# Patient Record
Sex: Female | Born: 1945 | Race: White | Hispanic: No | Smoking: Never smoker
Health system: Southern US, Community
[De-identification: ages and names within clinical notes are randomized; demographics above are authoritative.]

## PROBLEM LIST (undated history)

## (undated) DIAGNOSIS — I82409 Acute embolism and thrombosis of unspecified deep veins of unspecified lower extremity: Secondary | ICD-10-CM

## (undated) HISTORY — PX: BACK SURGERY: SHX140

## (undated) HISTORY — PX: ABDOMINAL HYSTERECTOMY: SHX81

---

## 2011-08-20 ENCOUNTER — Ambulatory Visit: Payer: Self-pay | Admitting: Internal Medicine

## 2011-09-22 DIAGNOSIS — E782 Mixed hyperlipidemia: Secondary | ICD-10-CM | POA: Insufficient documentation

## 2011-10-05 DIAGNOSIS — N952 Postmenopausal atrophic vaginitis: Secondary | ICD-10-CM | POA: Insufficient documentation

## 2012-03-19 ENCOUNTER — Ambulatory Visit: Payer: Self-pay | Admitting: Internal Medicine

## 2012-03-19 LAB — COMPREHENSIVE METABOLIC PANEL
Albumin: 3.6 g/dL (ref 3.4–5.0)
Alkaline Phosphatase: 83 U/L (ref 50–136)
Anion Gap: 6 — ABNORMAL LOW (ref 7–16)
Calcium, Total: 9.2 mg/dL (ref 8.5–10.1)
Chloride: 107 mmol/L (ref 98–107)
Co2: 30 mmol/L (ref 21–32)
EGFR (African American): >60
Glucose: 88 mg/dL (ref 65–99)
Osmolality: 284 (ref 275–301)
Potassium: 3.8 mmol/L (ref 3.5–5.1)
SGOT(AST): 24 U/L (ref 15–37)
Sodium: 143 mmol/L (ref 136–145)

## 2012-03-19 LAB — URINALYSIS, COMPLETE
Glucose,UR: NEGATIVE mg/dL (ref 0–75)
Nitrite: NEGATIVE
Protein: NEGATIVE
Specific Gravity: 1.005 (ref 1.003–1.030)

## 2012-03-19 LAB — CBC WITH DIFFERENTIAL/PLATELET
Basophil #: 0.1 10*3/uL (ref 0.0–0.1)
Basophil %: 0.8 %
Eosinophil #: 0.1 10*3/uL (ref 0.0–0.7)
Eosinophil %: 1.8 %
HGB: 13.8 g/dL (ref 12.0–16.0)
Lymphocyte %: 30.6 %
MCH: 30.6 pg (ref 26.0–34.0)
MCHC: 34.5 g/dL (ref 32.0–36.0)
Monocyte #: 0.6 x10 3/mm (ref 0.2–0.9)
Monocyte %: 8.2 %
Neutrophil %: 58.6 %
RBC: 4.51 10*6/uL (ref 3.80–5.20)
WBC: 7.8 10*3/uL (ref 3.6–11.0)

## 2012-03-19 LAB — SEDIMENTATION RATE: Erythrocyte Sed Rate: 13 mm/hr (ref 0–30)

## 2012-03-20 LAB — URINE CULTURE

## 2013-05-25 DIAGNOSIS — L900 Lichen sclerosus et atrophicus: Secondary | ICD-10-CM | POA: Insufficient documentation

## 2014-01-01 ENCOUNTER — Ambulatory Visit: Payer: Self-pay

## 2014-01-13 ENCOUNTER — Ambulatory Visit: Payer: Self-pay | Admitting: Family Medicine

## 2014-04-12 DIAGNOSIS — E559 Vitamin D deficiency, unspecified: Secondary | ICD-10-CM | POA: Insufficient documentation

## 2014-04-12 DIAGNOSIS — K219 Gastro-esophageal reflux disease without esophagitis: Secondary | ICD-10-CM | POA: Insufficient documentation

## 2014-04-13 DIAGNOSIS — M4802 Spinal stenosis, cervical region: Secondary | ICD-10-CM | POA: Insufficient documentation

## 2014-05-26 ENCOUNTER — Ambulatory Visit: Payer: Self-pay | Admitting: Family Medicine

## 2014-10-09 DIAGNOSIS — I517 Cardiomegaly: Secondary | ICD-10-CM | POA: Insufficient documentation

## 2014-12-05 ENCOUNTER — Ambulatory Visit
Admit: 2014-12-05 | Discharge: 2014-12-05 | Disposition: A | Discharge status: Home / Self Care | Payer: Medicare Other | Attending: Family Medicine | Admitting: Family Medicine

## 2014-12-05 ENCOUNTER — Ambulatory Visit
Admission: EM | Admit: 2014-12-05 | Discharge: 2014-12-05 | Disposition: A | Discharge status: Home / Self Care | Payer: Medicare Other | Attending: Family Medicine | Admitting: Family Medicine

## 2014-12-05 ENCOUNTER — Encounter: Payer: Self-pay | Admitting: Emergency Medicine

## 2014-12-05 DIAGNOSIS — M79661 Pain in right lower leg: Secondary | ICD-10-CM | POA: Insufficient documentation

## 2014-12-05 HISTORY — DX: Acute embolism and thrombosis of unspecified deep veins of unspecified lower extremity: I82.409

## 2014-12-05 NOTE — ED Notes (Signed)
US Doppler scheduled for 11:30am today at the [facility] location.

## 2014-12-05 NOTE — ED Notes (Signed)
Patient will return at 11:00am at the [facility] for her Doppler.

## 2014-12-05 NOTE — ED Notes (Signed)
Patient c/o right lower leg pain since Saturday. Patient reports pain worse in the morning and at night.  Patient denies swelling in her legs.

## 2014-12-05 NOTE — ED Provider Notes (Signed)
CSN: [id number]     Arrival date & time 12/05/14  0744 History   First MD Initiated Contact with Patient 12/05/14 [phone]     Chief Complaint  Patient presents with  . Leg Pain   (Consider location/radiation/quality/duration/timing/severity/associated sxs/prior Treatment) HPI Comments: 69 yo female with a 4 days h/o right calf pain. States felt like she was having a cramp Saturday night when symptoms began but pain has continued. 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 Denies recent surgeries, prolonged immobilization, or trauma/injuries. Denies chest pains, shortness of breath, numbness/tingling, fevers, chills, rash. Patient uses intravaginal estrogen cream.   Patient is a 69 y.o. female presenting with leg pain. The history is provided by the patient.  Leg Pain   Past Medical History  Diagnosis Date  . DVT (deep venous thrombosis)    Past Surgical History  Procedure Laterality Date  . Abdominal hysterectomy    . Back surgery     History reviewed. No pertinent family history. Social History  Substance Use Topics  . Smoking status: Never Smoker   . Smokeless tobacco: Never Used  . Alcohol Use: No   OB History    No data available     Review of Systems  Allergies  Review of patient's allergies indicates no known allergies.  Home Medications   Prior to Admission medications   Medication Sig Start Date End Date Taking? Authorizing Provider  conjugated estrogens (PREMARIN) vaginal cream Place 1 Applicatorful vaginally daily.   Yes Historical Provider, MD   Meds Ordered and Administered this Visit  Medications - No data to display  BP 108/69 mmHg  Pulse 66  Temp(Src) 96.7 F (35.9 C) (Tympanic)  Resp 16  Ht 5' 6.5" (1.689 m)  Wt 205 lb (92.987 kg)  BMI 32.60 kg/m2  SpO2 98% No data found.   Physical Exam  Constitutional: She appears well-developed and well-nourished. No distress.  Cardiovascular: Normal rate, regular rhythm and normal heart  sounds.   Pulmonary/Chest: Effort normal and breath sounds normal. No respiratory distress. She has no wheezes. She has no rales.  Musculoskeletal:  Right lower calf tenderness to palpation; positive Homan's sign; extremity neurovascularly intact; no edema or erythema; skin intact  Neurological: She is alert.  Skin: She is not diaphoretic.  Vitals reviewed.   ED Course  Procedures (including critical care time)  Labs Review Labs Reviewed - No data to display  Imaging Review No results found.   Visual Acuity Review  Right Eye Distance:   Left Eye Distance:   Bilateral Distance:    Right Eye Near:   Left Eye Near:    Bilateral Near:         MDM   1. Calf pain, right    Plan: 1. possible diagnosis (etiologies) reviewed with patient 2. Recommend venous doppler US today to rule out DVT; will have done this morning; further management pending [place] results.  Payton Mccallum, MD 12/05/14 [phone]

## 2015-02-15 ENCOUNTER — Other Ambulatory Visit: Payer: Self-pay | Admitting: Family Medicine

## 2015-02-15 ENCOUNTER — Ambulatory Visit
Admission: RE | Admit: 2015-02-15 | Discharge: 2015-02-15 | Disposition: A | Discharge status: Home / Self Care | Payer: Medicare Other | Source: Ambulatory Visit | Attending: Family Medicine | Admitting: Family Medicine

## 2015-02-15 DIAGNOSIS — Z1231 Encounter for screening mammogram for malignant neoplasm of breast: Secondary | ICD-10-CM | POA: Insufficient documentation

## 2015-02-26 ENCOUNTER — Other Ambulatory Visit: Payer: Self-pay | Admitting: Family Medicine

## 2015-02-26 DIAGNOSIS — R928 Other abnormal and inconclusive findings on diagnostic imaging of breast: Secondary | ICD-10-CM

## 2015-03-09 ENCOUNTER — Ambulatory Visit
Admission: RE | Admit: 2015-03-09 | Discharge: 2015-03-09 | Disposition: A | Discharge status: Home / Self Care | Payer: Medicare Other | Source: Ambulatory Visit | Attending: Family Medicine | Admitting: Family Medicine

## 2015-03-09 DIAGNOSIS — R928 Other abnormal and inconclusive findings on diagnostic imaging of breast: Secondary | ICD-10-CM | POA: Insufficient documentation

## 2015-05-31 ENCOUNTER — Encounter: Payer: Self-pay | Admitting: Emergency Medicine

## 2015-05-31 ENCOUNTER — Ambulatory Visit
Admission: EM | Admit: 2015-05-31 | Discharge: 2015-05-31 | Disposition: A | Discharge status: Home / Self Care | Payer: Medicare Other | Attending: Emergency Medicine | Admitting: Emergency Medicine

## 2015-05-31 DIAGNOSIS — R05 Cough: Secondary | ICD-10-CM | POA: Diagnosis present

## 2015-05-31 DIAGNOSIS — Z86718 Personal history of other venous thrombosis and embolism: Secondary | ICD-10-CM | POA: Insufficient documentation

## 2015-05-31 DIAGNOSIS — J101 Influenza due to other identified influenza virus with other respiratory manifestations: Secondary | ICD-10-CM

## 2015-05-31 DIAGNOSIS — J029 Acute pharyngitis, unspecified: Secondary | ICD-10-CM | POA: Diagnosis present

## 2015-05-31 DIAGNOSIS — J09X2 Influenza due to identified novel influenza A virus with other respiratory manifestations: Secondary | ICD-10-CM | POA: Diagnosis not present

## 2015-05-31 LAB — RAPID INFLUENZA A&B ANTIGENS
Influenza A (ARMC): DETECTED
Influenza B (ARMC): NOT DETECTED

## 2015-05-31 MED ORDER — ALBUTEROL SULFATE HFA 108 (90 BASE) MCG/ACT IN AERS: 1.0000 | INHALATION_SPRAY | Freq: Four times a day (QID) | RESPIRATORY_TRACT | Status: DC | PRN

## 2015-05-31 MED ORDER — AEROCHAMBER PLUS MISC: Status: DC

## 2015-05-31 MED ORDER — OSELTAMIVIR PHOSPHATE 75 MG PO CAPS: 75.0000 mg | ORAL_CAPSULE | Freq: Two times a day (BID) | ORAL | Status: DC

## 2015-05-31 MED ORDER — BENZONATATE 100 MG PO CAPS: 100.0000 mg | ORAL_CAPSULE | Freq: Three times a day (TID) | ORAL | Status: DC

## 2015-05-31 NOTE — Discharge Instructions (Signed)
Tylenol, ibuprofen as needed for pain. Albuterol 2 puffs every 4-6 hours as needed for coughing, wheezing. Tessalon Perles help his sleep. Start some saline nasal irrigation, Mucinex or Mucinex D if nasal congestion becomes a problem. drink plenty of extra fluids.

## 2015-05-31 NOTE — ED Notes (Signed)
Patient c/o cough and chest congestion for 3 days.  Patient denies fevers.  

## 2015-05-31 NOTE — ED Provider Notes (Signed)
HPI  SUBJECTIVE:  Brenda Mathews is a 70 y.o. female who presents with sore throat, body aches, mild headache, feels feverish with chills, chest congestion, nonproductive cough for 3 days. States that she can't sleep secondary to cough.  States feels "hot" but no measured fevers at home. No aggravating or alleviating factors. She tried Delsym, Claritin-D. No  N/V,  neck pain, neck stiffness, ear pain, sinus pain/pressure, chest pain, wheeze, SOB, abd pain, rash, urinary c/o, skin infection, Slightly decreased appetite but is tolerating po.  Did get flu shot this year. Also has gotten pneumonia shot only. Has taken Tylenol  in the past 4-6 hours. Grandson with flulike symptoms 5 days ago. Past medical history negative for asthma, emphysema, COPD, smoking, diabetes, hypertension, coronary artery disease, MI, arrhythmia. 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 She is not any anticoagulants or antiplatelets. PMD Dr. Greggory Stallion at [facility] primary care.   Past Medical History  Diagnosis Date  . DVT (deep venous thrombosis) [facility])     Past Surgical History  Procedure Laterality Date  . Abdominal hysterectomy    . Back surgery      History reviewed. No pertinent family history.  Social History  Substance Use Topics  . Smoking status: Never Smoker   . Smokeless tobacco: Never Used  . Alcohol Use: No    No current facility-administered medications for this encounter.  Current outpatient prescriptions:  .  albuterol (PROVENTIL HFA;VENTOLIN HFA) 108 (90 Base) MCG/ACT inhaler, Inhale 1-2 puffs into the lungs every 6 (six) hours as needed for wheezing or shortness of breath., Disp: 1 Inhaler, Rfl: 0 .  benzonatate (TESSALON) 100 MG capsule, Take 1 capsule (100 mg total) by mouth every 8 (eight) hours., Disp: 21 capsule, Rfl: 0 .  conjugated estrogens (PREMARIN) vaginal cream, Place 1 Applicatorful vaginally daily., Disp: , Rfl:  .  oseltamivir (TAMIFLU) 75 MG capsule, Take 1 capsule  (75 mg total) by mouth 2 (two) times daily. X 5 days, Disp: 10 capsule, Rfl: 0 .  Spacer/Aero-Holding Chambers (AEROCHAMBER PLUS) inhaler, Use as instructed, Disp: 1 each, Rfl: 2  No Known Allergies   ROS  As noted in HPI.   Physical Exam  BP 128/79 mmHg  Pulse 75  Temp(Src) 97.9 F (36.6 C) (Tympanic)  Resp 16  Ht  (1.702 m)  Wt 200 lb (90.719 kg)  BMI 31.32 kg/m2  SpO2 99%  Constitutional: Well developed, well nourished, no acute distress. Deep cough Eyes: PERRL, EOMI, conjunctiva normal bilaterally HENT: Normocephalic, atraumatic,mucus membranes moist. TMs normal, + nasal congstion no sinus tenderness oropharynx nml no cobblestoning, postnasal drip, tonsils absent Lymph: No cervical lymphadenopathy Respiratory: Clear to auscultation bilaterally, no rales, no wheezing, no rhonchi, no chest wall tenderness Cardiovascular: Normal rate and rhythm, no murmurs, no gallops, no rubs GI: Soft, nondistended, normal bowel sounds, nontender, no rebound, no guarding Back: no CVAT skin: No rash, skin intact Musculoskeletal: No edema, no tenderness, no deformities Neurologic: Alert & oriented x 3, CN II-XII grossly intact, no motor deficits, sensation grossly intact Psychiatric: Speech and behavior appropriate   ED Course   Medications - No data to display  Orders Placed This Encounter  Procedures  . Rapid Influenza A&B Antigens ([facility] only)    Standing Status: Standing     Number of Occurrences: 1     Standing Expiration Date:   . Droplet precaution    Standing Status: Standing     Number of Occurrences: 1     Standing  Expiration Date:    Results for orders placed or performed during the hospital encounter of 05/31/15 (from the past 24 hour(s))  Rapid Influenza A&B Antigens ([facility] only)     Status: None   Collection Time: 05/31/15  1:27 PM  Result Value Ref Range   Influenza A ([facility]) DETECTED    Influenza B ([facility]) NOT DETECTED    No results found.  ED Clinical  Impression  Influenza A   ED Assessment/Plan Flu A+.  Patient afebrile here, but has taken antipyretic in the past 4-6 hours. Flu A+. Home with Tamiflu, Home with albuterol with spacer, Tessalon Perles, she is to take Tylenol ibuprofen as needed for headaches and body aches, follow-up with primary care physician as needed. To the ER if gets worse.   Discussed labs,  MDM, plan and followup with patient. Discussed sn/sx that should prompt return to the  ED. Patient agrees with plan.   *This clinic note was created using Dragon dictation software. Therefore, there may be occasional mistakes despite careful proofreading.  ?   Domenick Gong, MD 05/31/15 1401

## 2015-06-19 ENCOUNTER — Ambulatory Visit
Admission: EM | Admit: 2015-06-19 | Discharge: 2015-06-19 | Disposition: A | Discharge status: Home / Self Care | Payer: Medicare Other | Attending: Family Medicine | Admitting: Family Medicine

## 2015-06-19 DIAGNOSIS — J069 Acute upper respiratory infection, unspecified: Secondary | ICD-10-CM | POA: Diagnosis not present

## 2015-06-19 MED ORDER — HYDROCOD POLST-CPM POLST ER 10-8 MG/5ML PO SUER: 5.0000 mL | Freq: Two times a day (BID) | ORAL | Status: DC

## 2015-06-19 MED ORDER — AZITHROMYCIN 250 MG PO TABS: ORAL_TABLET | ORAL | Status: DC

## 2015-06-19 NOTE — ED Provider Notes (Signed)
CSN: [id number]     Arrival date & time 06/19/15  1025 History   First MD Initiated Contact with Patient 06/19/15 1152     Chief Complaint  Patient presents with  . URI   (Consider location/radiation/quality/duration/timing/severity/associated sxs/prior Treatment) HPI   70 year old female who is here on 05/31/2015 diagnosed with influenza A and treated with Tamiflu. Patient states that she did improve somewhat but maintained her fatigue and soon started having nasal drainage and congestion. Has left ear pain cough and chills have worsened over the last 3-4 days. Using over-the-counter preparations which she actually doubled up on her NyQuil last night stating that she just wanted to sleep. She states that her fatigue is significant which is seem to lower her resistance. She does admit to trying to take 1 amoxicillin this morning but that's all she had left her previous prescription. She has feels very run down as a great deal of drainage from her sinuses and is coughing at nighttime keeping her awake  Past Medical History  Diagnosis Date  . DVT (deep venous thrombosis) [facility])    Past Surgical History  Procedure Laterality Date  . Abdominal hysterectomy    . Back surgery     History reviewed. No pertinent family history. Social History  Substance Use Topics  . Smoking status: Never Smoker   . Smokeless tobacco: Never Used  . Alcohol Use: No   OB History    No data available     Review of Systems  Constitutional: Positive for chills, activity change and fatigue. Negative for fever.  HENT: Positive for congestion, postnasal drip, rhinorrhea, sinus pressure and sneezing.   Respiratory: Positive for cough. Negative for shortness of breath, wheezing and stridor.   All other systems reviewed and are negative.   Allergies  Review of patient's allergies indicates no known allergies.  Home Medications   Prior to Admission medications   Medication Sig Start Date End Date Taking?  Authorizing Provider  fluticasone (FLONASE) 50 MCG/ACT nasal spray Place 1 spray into both nostrils daily.   Yes Historical Provider, MD  albuterol (PROVENTIL HFA;VENTOLIN HFA) 108 (90 Base) MCG/ACT inhaler Inhale 1-2 puffs into the lungs every 6 (six) hours as needed for wheezing or shortness of breath. 05/31/15   Domenick Gong, MD  azithromycin (ZITHROMAX Z-PAK) 250 MG tablet Use as per package instructions 06/19/15   Lutricia Feil, PA-C  benzonatate (TESSALON) 100 MG capsule Take 1 capsule (100 mg total) by mouth every 8 (eight) hours. 05/31/15   Domenick Gong, MD  chlorpheniramine-HYDROcodone [facility]) 10-8 MG/5ML SUER Take 5 mLs by mouth 2 (two) times daily. 06/19/15   Lutricia Feil, PA-C  conjugated estrogens (PREMARIN) vaginal cream Place 1 Applicatorful vaginally daily.    Historical Provider, MD  oseltamivir (TAMIFLU) 75 MG capsule Take 1 capsule (75 mg total) by mouth 2 (two) times daily. X 5 days 05/31/15   Domenick Gong, MD  Spacer/Aero-Holding Chambers (AEROCHAMBER PLUS) inhaler Use as instructed 05/31/15   Domenick Gong, MD   Meds Ordered and Administered this Visit  Medications - No data to display  BP 114/74 mmHg  Pulse 75  Temp(Src) 97.8 F (36.6 C) (Oral)  Resp 16  Ht  (1.702 m)  Wt 200 lb (90.719 kg)  BMI 31.32 kg/m2  SpO2 100% No data found.   Physical Exam  Constitutional: She is oriented to person, place, and time. She appears well-developed and well-nourished. No distress.  HENT:  Head: Normocephalic and atraumatic.  Nose:  Nose normal.  Mouth/Throat: Oropharynx is clear and moist. No oropharyngeal exudate.  She has sclerosis on both of her TMs  Eyes: Conjunctivae are normal. Pupils are equal, round, and reactive to light.  Neck: Normal range of motion. Neck supple.  Pulmonary/Chest: Effort normal and breath sounds normal. No respiratory distress. She has no wheezes. She has no rales.  Musculoskeletal: Normal range of motion.  She exhibits no edema or tenderness.  Lymphadenopathy:    She has no cervical adenopathy.  Neurological: She is alert and oriented to person, place, and time.  Skin: Skin is warm and dry. She is not diaphoretic.  Psychiatric: She has a normal mood and affect. Her behavior is normal. Judgment and thought content normal.  Nursing note and vitals reviewed.   ED Course  Procedures (including critical care time)  Labs Review Labs Reviewed - No data to display  Imaging Review No results found.   Visual Acuity Review  Right Eye Distance:   Left Eye Distance:   Bilateral Distance:    Right Eye Near:   Left Eye Near:    Bilateral Near:         MDM   1. Acute URI    New Prescriptions   AZITHROMYCIN (ZITHROMAX Z-PAK) 250 MG TABLET    Use as per package instructions   CHLORPHENIRAMINE-HYDROCODONE (TUSSIONEX PENNKINETIC ER) 10-8 MG/5ML SUER    Take 5 mLs by mouth 2 (two) times daily.  Plan: 1. Test/x-ray results and diagnosis reviewed with patient 2. rx as per orders; risks, benefits, potential side effects reviewed with patient 3. Recommend supportive treatment with Rest and fluids. Start her on a Z-Pak and that the sinus to help with her doesn't seem to be continuing after influenza A. She will continue over-the-counter medicines as necessary. She should follow with her primary care physician if she continues to have problems or is not improving  4. F/u prn if symptoms worsen or don't improve     Lutricia FeilWilliam P Roemer, PA-C 06/19/15 1241

## 2015-06-19 NOTE — ED Notes (Signed)
Patient was here on 05/31/2015 in our office and was diagnosed with Influenza.  Today she c/o nasal drainage/congestion, left ear pain, cough, and chills which have all gotten worse in the last 3-4 days.

## 2015-06-19 NOTE — Discharge Instructions (Signed)
Cool Mist Vaporizers °Vaporizers may help relieve the symptoms of a cough and cold. They add moisture to the air, which helps mucus to become thinner and less sticky. This makes it easier to breathe and cough up secretions. Cool mist vaporizers do not cause serious burns like hot mist vaporizers, which may also be called steamers or humidifiers. Vaporizers have not been proven to help with colds. You should not use a vaporizer if you are allergic to mold. °HOME CARE INSTRUCTIONS °· Follow the package instructions for the vaporizer. °· Do not use anything other than distilled water in the vaporizer. °· Do not run the vaporizer all of the time. This can cause mold or bacteria to grow in the vaporizer. °· Clean the vaporizer after each time it is used. °· Clean and dry the vaporizer well before storing it. °· Stop using the vaporizer if worsening respiratory symptoms develop. °  °This information is not intended to replace advice given to you by your health care provider. Make sure you discuss any questions you have with your health care provider. °  °Document Released: 12/20/2003 Document Revised: 03/29/2013 Document Reviewed: 08/11/2012 °Elsevier Interactive Patient Education ©2016 [employer] ° °Upper Respiratory Infection, Adult °Most upper respiratory infections (URIs) are a viral infection of the air passages leading to the lungs. A URI affects the nose, throat, and upper air passages. The most common type of URI is nasopharyngitis and is typically referred to as "the common cold." °URIs run their course and usually go away on their own. Most of the time, a URI does not require medical attention, but sometimes a bacterial infection in the upper airways can follow a viral infection. This is called a secondary infection. Sinus and middle ear infections are common types of secondary upper respiratory infections. °Bacterial pneumonia can also complicate a URI. A URI can worsen asthma and chronic obstructive  pulmonary disease (COPD). Sometimes, these complications can require emergency medical care and may be life threatening.  °CAUSES °Almost all URIs are caused by viruses. A virus is a type of germ and can spread from one person to another.  °RISKS FACTORS °You may be at risk for a URI if:  °· You smoke.   °· You have chronic heart or lung disease. °· You have a weakened defense (immune) system.   °· You are very young or very old.   °· You have nasal allergies or asthma. °· You work in crowded or poorly ventilated areas. °· You work in health care facilities or schools. °SIGNS AND SYMPTOMS  °Symptoms typically develop 2-3 days after you come in contact with a cold virus. Most viral URIs last 7-10 days. However, viral URIs from the influenza virus (flu virus) can last 14-18 days and are typically more severe. Symptoms may include:  °· Runny or stuffy (congested) nose.   °· Sneezing.   °· Cough.   °· Sore throat.   °· Headache.   °· Fatigue.   °· Fever.   °· Loss of appetite.   °· Pain in your forehead, behind your eyes, and over your cheekbones (sinus pain). °· Muscle aches.   °DIAGNOSIS  °Your health care provider may diagnose a URI by: °· Physical exam. °· Tests to check that your symptoms are not due to another condition such as: °¨ Strep throat. °¨ Sinusitis. °¨ Pneumonia. °¨ Asthma. °TREATMENT  °A URI goes away on its own with time. It cannot be cured with medicines, but medicines may be prescribed or recommended to relieve symptoms. Medicines may help: °· Reduce your fever. °· Reduce   your cough. °· Relieve nasal congestion. °HOME CARE INSTRUCTIONS  °· Take medicines only as directed by your health care provider.   °· Gargle warm saltwater or take cough drops to comfort your throat as directed by your health care provider. °· Use a warm mist humidifier or inhale steam from a shower to increase air moisture. This may make it easier to breathe. °· Drink enough fluid to keep your urine clear or pale yellow.   °· Eat  soups and other clear broths and maintain good nutrition.   °· Rest as needed.   °· Return to work when your temperature has returned to normal or as your health care provider advises. You may need to stay home longer to avoid infecting others. You can also use a face mask and careful hand washing to prevent spread of the virus. °· Increase the usage of your inhaler if you have asthma.   °· Do not use any tobacco products, including cigarettes, chewing tobacco, or electronic cigarettes. If you need help quitting, ask your health care provider. °PREVENTION  °The best way to protect yourself from getting a cold is to practice good hygiene.  °· Avoid oral or hand contact with people with cold symptoms.   °· Wash your hands often if contact occurs.   °There is no clear evidence that vitamin C, vitamin E, echinacea, or exercise reduces the chance of developing a cold. However, it is always recommended to get plenty of rest, exercise, and practice good nutrition.  °SEEK MEDICAL CARE IF:  °· You are getting worse rather than better.   °· Your symptoms are not controlled by medicine.   °· You have chills. °· You have worsening shortness of breath. °· You have brown or red mucus. °· You have yellow or brown nasal discharge. °· You have pain in your face, especially when you bend forward. °· You have a fever. °· You have swollen neck glands. °· You have pain while swallowing. °· You have white areas in the back of your throat. °SEEK IMMEDIATE MEDICAL CARE IF:  °· You have severe or persistent: °¨ Headache. °¨ Ear pain. °¨ Sinus pain. °¨ Chest pain. °· You have chronic lung disease and any of the following: °¨ Wheezing. °¨ Prolonged cough. °¨ Coughing up blood. °¨ A change in your usual mucus. °· You have a stiff neck. °· You have changes in your: °¨ Vision. °¨ Hearing. °¨ Thinking. °¨ Mood. °MAKE SURE YOU:  °· Understand these instructions. °· Will watch your condition. °· Will get help right away if you are not doing well or  get worse. °  °This information is not intended to replace advice given to you by your health care provider. Make sure you discuss any questions you have with your health care provider. °  °Document Released: 09/17/2000 Document Revised: 08/08/2014 Document Reviewed: 06/29/2013 °Elsevier Interactive Patient Education ©2016 Elsevier Inc. ° °

## 2015-07-10 DIAGNOSIS — K644 Residual hemorrhoidal skin tags: Secondary | ICD-10-CM | POA: Insufficient documentation

## 2015-12-27 ENCOUNTER — Other Ambulatory Visit: Payer: Self-pay | Admitting: Family Medicine

## 2015-12-27 DIAGNOSIS — Z780 Asymptomatic menopausal state: Secondary | ICD-10-CM

## 2016-01-01 ENCOUNTER — Other Ambulatory Visit: Payer: Self-pay | Admitting: Family Medicine

## 2016-01-01 DIAGNOSIS — R3129 Other microscopic hematuria: Secondary | ICD-10-CM

## 2016-01-08 ENCOUNTER — Other Ambulatory Visit
Admission: RE | Admit: 2016-01-08 | Discharge: 2016-01-08 | Disposition: A | Discharge status: Home / Self Care | Payer: Medicare Other | Source: Ambulatory Visit | Attending: Family Medicine | Admitting: Family Medicine

## 2016-01-08 ENCOUNTER — Ambulatory Visit
Admission: RE | Admit: 2016-01-08 | Discharge: 2016-01-08 | Disposition: A | Discharge status: Home / Self Care | Payer: Medicare Other | Source: Ambulatory Visit | Attending: Family Medicine | Admitting: Family Medicine

## 2016-01-08 DIAGNOSIS — R3129 Other microscopic hematuria: Secondary | ICD-10-CM | POA: Insufficient documentation

## 2016-01-08 DIAGNOSIS — N1330 Unspecified hydronephrosis: Secondary | ICD-10-CM | POA: Diagnosis not present

## 2016-01-08 DIAGNOSIS — N200 Calculus of kidney: Secondary | ICD-10-CM | POA: Insufficient documentation

## 2016-01-08 DIAGNOSIS — K838 Other specified diseases of biliary tract: Secondary | ICD-10-CM | POA: Diagnosis not present

## 2016-01-08 LAB — CREATININE, SERUM: Creatinine, Ser: 0.77 mg/dL (ref 0.44–1.00)

## 2016-01-08 MED ORDER — IOPAMIDOL (ISOVUE-300) INJECTION 61%
150.0000 mL | Freq: Once | INTRAVENOUS | Status: AC | PRN
  Administered 2016-01-08: 125 mL via INTRAVENOUS

## 2016-08-12 ENCOUNTER — Ambulatory Visit
Admission: EM | Admit: 2016-08-12 | Discharge: 2016-08-12 | Disposition: A | Discharge status: Home / Self Care | Payer: Medicare Other | Attending: Family Medicine | Admitting: Family Medicine

## 2016-08-12 ENCOUNTER — Encounter: Payer: Self-pay | Admitting: Emergency Medicine

## 2016-08-12 DIAGNOSIS — J0101 Acute recurrent maxillary sinusitis: Secondary | ICD-10-CM

## 2016-08-12 MED ORDER — AMOXICILLIN-POT CLAVULANATE 875-125 MG PO TABS: 1.0000 | ORAL_TABLET | Freq: Two times a day (BID) | ORAL | 0 refills | Status: DC

## 2016-08-12 NOTE — ED Triage Notes (Signed)
Patient c/o sinus congestion and pain, sneezing and fever for 2 days.

## 2016-08-12 NOTE — ED Provider Notes (Signed)
CSN: [id number]     Arrival date & time 08/12/16  0808 History   First MD Initiated Contact with Patient 08/12/16 0830     Chief Complaint  Patient presents with  . Sinus Problem   (Consider location/radiation/quality/duration/timing/severity/associated sxs/prior Treatment) HPI  This a 71 year old female who presents with sinus congestion and pain sneezing and fever to 101.5 for 2 days. HShe states that for the 2 days she has had terrible facial pain teeth pain jaw pain along with the fever. His had sinus infections in the past which seems very similar. Has felt terrible. She took amoxicillin 500 mg twice daily that she had left over from a previous illness.Has taken 4 doses but does not seem to be making her feel better although her facial pain has decreased.        Past Medical History:  Diagnosis Date  . DVT (deep venous thrombosis) (HCC)    Past Surgical History:  Procedure Laterality Date  . ABDOMINAL HYSTERECTOMY    . BACK SURGERY     History reviewed. No pertinent family history. Social History  Substance Use Topics  . Smoking status: Never Smoker  . Smokeless tobacco: Never Used  . Alcohol use No   OB History    No data available     Review of Systems  Constitutional: Positive for activity change, chills, fatigue and fever.  HENT: Positive for congestion, postnasal drip, rhinorrhea, sinus pain, sinus pressure and sneezing.   Respiratory: Positive for cough. Negative for shortness of breath, wheezing and stridor.   All other systems reviewed and are negative.   Allergies  Patient has no known allergies.  Home Medications   Prior to Admission medications   Medication Sig Start Date End Date Taking? Authorizing Provider  albuterol (PROVENTIL HFA;VENTOLIN HFA) 108 (90 Base) MCG/ACT inhaler Inhale 1-2 puffs into the lungs every 6 (six) hours as needed for wheezing or shortness of breath. 05/31/15   Domenick GongMortenson, Ashley, MD  amoxicillin-clavulanate (AUGMENTIN) 875-125  MG tablet Take 1 tablet by mouth every 12 (twelve) hours. 08/12/16   Lutricia Feiloemer, Dian Minahan P, PA-C  conjugated estrogens (PREMARIN) vaginal cream Place 1 Applicatorful vaginally daily.    [provider]  fluticasone (FLONASE) 50 MCG/ACT nasal spray Place 1 spray into both nostrils daily.    [provider]  Spacer/Aero-Holding Chambers (AEROCHAMBER PLUS) inhaler Use as instructed 05/31/15   Domenick GongMortenson, Ashley, MD   Meds Ordered and Administered this Visit  Medications - No data to display  BP 105/69 (BP Location: Right Arm)   Pulse 79   Temp 98.6 F (37 C) (Oral)   Resp 16   Ht 5' 6.5" (1.689 m)   Wt 200 lb (90.7 kg)   SpO2 96%   BMI 31.80 kg/m  No data found.   Physical Exam  Constitutional: She is oriented to person, place, and time. She appears well-developed and well-nourished. No distress.  HENT:  Head: Normocephalic and atraumatic.  Nose: Nose normal.  Mouth/Throat: Oropharynx is clear and moist.  Both TMs are dark  Eyes: EOM are normal. Pupils are equal, round, and reactive to light. Right eye exhibits no discharge. Left eye exhibits no discharge.  Neck: Normal range of motion. Neck supple.  Pulmonary/Chest: Effort normal and breath sounds normal. No respiratory distress. She has no wheezes. She has no rales.  Musculoskeletal: Normal range of motion.  Lymphadenopathy:    She has no cervical adenopathy.  Neurological: She is alert and oriented to person, place, and time.  Skin: Skin  is warm and dry. She is not diaphoretic.  Psychiatric: She has a normal mood and affect. Her behavior is normal. Judgment and thought content normal.  Nursing note and vitals reviewed.   Urgent Care Course     Procedures (including critical care time)  Labs Review Labs Reviewed - No data to display  Imaging Review No results found.   Visual Acuity Review  Right Eye Distance:   Left Eye Distance:   Bilateral Distance:    Right Eye Near:   Left Eye Near:     Bilateral Near:         MDM   1. Acute recurrent maxillary sinusitis    Discharge Medication List as of 08/12/2016  8:53 AM    START taking these medications   Details  amoxicillin-clavulanate (AUGMENTIN) 875-125 MG tablet Take 1 tablet by mouth every 12 (twelve) hours., Starting Tue 08/12/2016, Normal      Plan: 1. Test/x-ray results and diagnosis reviewed with patient 2. rx as per orders; risks, benefits, potential side effects reviewed with patient 3. Recommend supportive treatment with Rest and fluids. Tylenol or Motrin for fever and pain. Use of Flonase for at least a month and consider the use of a Nettie pot to augment the Flonase. If you not improving follow-up with your primary care physician or an ENT. 4. F/u prn if symptoms worsen or don't improve     Lutricia Feil, PA-C 08/12/16 0859    Lutricia Feil, PA-C 08/12/16 [phone]

## 2017-01-07 ENCOUNTER — Other Ambulatory Visit: Payer: Self-pay | Admitting: Family Medicine

## 2017-01-07 DIAGNOSIS — Z1231 Encounter for screening mammogram for malignant neoplasm of breast: Secondary | ICD-10-CM

## 2017-01-07 DIAGNOSIS — E2839 Other primary ovarian failure: Secondary | ICD-10-CM

## 2017-02-02 ENCOUNTER — Inpatient Hospital Stay: Admission: RE | Admit: 2017-02-02 | Payer: Medicare Other | Source: Ambulatory Visit

## 2017-03-03 ENCOUNTER — Ambulatory Visit
Admission: RE | Admit: 2017-03-03 | Discharge: 2017-03-03 | Disposition: A | Discharge status: Home / Self Care | Payer: Medicare Other | Source: Ambulatory Visit | Attending: Family Medicine | Admitting: Family Medicine

## 2017-03-03 DIAGNOSIS — M8588 Other specified disorders of bone density and structure, other site: Secondary | ICD-10-CM | POA: Diagnosis not present

## 2017-03-03 DIAGNOSIS — Z780 Asymptomatic menopausal state: Secondary | ICD-10-CM | POA: Diagnosis present

## 2017-03-03 DIAGNOSIS — Z1231 Encounter for screening mammogram for malignant neoplasm of breast: Secondary | ICD-10-CM | POA: Insufficient documentation

## 2017-03-03 DIAGNOSIS — E2839 Other primary ovarian failure: Secondary | ICD-10-CM

## 2017-03-10 DIAGNOSIS — M85832 Other specified disorders of bone density and structure, left forearm: Secondary | ICD-10-CM | POA: Insufficient documentation

## 2017-04-02 ENCOUNTER — Ambulatory Visit
Admission: EM | Admit: 2017-04-02 | Discharge: 2017-04-02 | Disposition: A | Discharge status: Home / Self Care | Payer: Medicare Other | Attending: Family Medicine | Admitting: Family Medicine

## 2017-04-02 ENCOUNTER — Ambulatory Visit: Payer: Medicare Other

## 2017-04-02 ENCOUNTER — Encounter: Payer: Self-pay | Admitting: Emergency Medicine

## 2017-04-02 ENCOUNTER — Other Ambulatory Visit: Payer: Self-pay

## 2017-04-02 DIAGNOSIS — R05 Cough: Secondary | ICD-10-CM | POA: Diagnosis not present

## 2017-04-02 DIAGNOSIS — R0789 Other chest pain: Secondary | ICD-10-CM

## 2017-04-02 DIAGNOSIS — J410 Simple chronic bronchitis: Secondary | ICD-10-CM

## 2017-04-02 MED ORDER — HYDROCOD POLST-CPM POLST ER 10-8 MG/5ML PO SUER: 5.0000 mL | Freq: Two times a day (BID) | ORAL | 0 refills | Status: DC | PRN

## 2017-04-02 MED ORDER — ALBUTEROL SULFATE HFA 108 (90 BASE) MCG/ACT IN AERS: 1.0000 | INHALATION_SPRAY | Freq: Four times a day (QID) | RESPIRATORY_TRACT | 0 refills | Status: DC | PRN

## 2017-04-02 MED ORDER — BUDESONIDE 180 MCG/ACT IN AEPB: 1.0000 | INHALATION_SPRAY | Freq: Two times a day (BID) | RESPIRATORY_TRACT | 0 refills | Status: DC

## 2017-04-02 NOTE — ED Provider Notes (Signed)
[facility]    CSN: [id number] Arrival date & time: 04/02/17  1638  History   Chief Complaint Chief Complaint  Patient presents with  . Cough   HPI  71 year old female presents with a 21107-month history of cough.  Patient reports that she is been sick for the past 3 months.  She states that she has seen her primary care office 3 times.  She has been prescribed antibiotics on 2 occasions and has been prescribed steroids as well.  She is also been given cough medication.  He states that she has not improved.  She continues to have severe cough which is worse at night.  She states it is productive of green sputum.  Patient states that she is having chest and rib discomfort and feels like she has "pulled something a loose".  No known exacerbating factors.  No reports of fever recently.  She states that she did have a fever early in the course of her illness.  No reports of tobacco abuse.  She has been exposed to secondhand smoke from her parents when she was growing up.  No other associated symptoms.  No other complaints at this time.  Past Medical History:  Diagnosis Date  . DVT (deep venous thrombosis) (HCC)    Past Surgical History:  Procedure Laterality Date  . ABDOMINAL HYSTERECTOMY    . BACK SURGERY     OB History    No data available     Home Medications    Prior to Admission medications   Medication Sig Start Date End Date Taking? Authorizing Provider  conjugated estrogens (PREMARIN) vaginal cream Place 1 Applicatorful vaginally daily.   Yes [provider]  albuterol (PROVENTIL HFA;VENTOLIN HFA) 108 (90 Base) MCG/ACT inhaler Inhale 1-2 puffs into the lungs every 6 (six) hours as needed for wheezing or shortness of breath. 04/02/17   Everlene Otherook, Gid Schoffstall G, DO  budesonide (PULMICORT) 180 MCG/ACT inhaler Inhale 1 puff into the lungs 2 (two) times daily. 04/02/17   Tommie Samsook, Maurice Ramseur G, DO  chlorpheniramine-HYDROcodone (TUSSIONEX PENNKINETIC ER) 10-8 MG/5ML SUER Take 5 mLs  by mouth every 12 (twelve) hours as needed. 04/02/17   Tommie Samsook, Denton Derks G, DO   Family History Family History  Problem Relation Age of Onset  . COPD Mother   . Cirrhosis Father        alcoholic  . Diabetes Father   . Asthma Father    Social History Social History   Tobacco Use  . Smoking status: Never Smoker  . Smokeless tobacco: Never Used  Substance Use Topics  . Alcohol use: No  . Drug use: No   Allergies   Patient has no known allergies.   Review of Systems Review of Systems  Constitutional: Negative for chills and fever.  Respiratory: Positive for cough and chest tightness.    Physical Exam Triage Vital Signs ED Triage Vitals  Enc Vitals Group     BP 04/02/17 1647 134/70     Pulse Rate 04/02/17 1647 73     Resp 04/02/17 1647 16     Temp 04/02/17 1647 97.9 F (36.6 C)     Temp Source 04/02/17 1647 Oral     SpO2 04/02/17 1647 98 %     Weight 04/02/17 1646 200 lb (90.7 kg)     Height 04/02/17 1646 5\' 7"  (1.702 m)     Head Circumference --      Peak Flow --      Pain Score 04/02/17 1647 4  Pain Loc --      Pain Edu? --      Excl. in GC? --    Updated Vital Signs BP 134/70 (BP Location: Left Arm)   Pulse 73   Temp 97.9 F (36.6 C) (Oral)   Resp 16   Ht 5\' 7"  (1.702 m)   Wt 200 lb (90.7 kg)   SpO2 98%   BMI 31.32 kg/m   Physical Exam  Constitutional: She is oriented to person, place, and time.  Well-developed, well-nourished.  Appears fatigued but does not appear toxic.  HENT:  Head: Normocephalic and atraumatic.  Mouth/Throat: Oropharynx is clear and moist.  Eyes: Conjunctivae are normal.  Neck: Neck supple.  Cardiovascular: Normal rate and regular rhythm.  Pulmonary/Chest: Effort normal. She has no wheezes.  Left basilar crackles.  Lymphadenopathy:    She has no cervical adenopathy.  Neurological: She is alert and oriented to person, place, and time.  Psychiatric: She has a normal mood and affect. Her behavior is normal.  Nursing note and  vitals reviewed.  UC Treatments / Results  Labs (all labs ordered are listed, but only abnormal results are displayed) Labs Reviewed - No data to display  EKG  EKG Interpretation None       Radiology Dg Chest 2 View  Result Date: 04/02/2017 CLINICAL DATA:  Cough and fever EXAM: CHEST  2 VIEW COMPARISON:  Aug 20, 2011 FINDINGS: There is no edema or consolidation. The heart size and pulmonary vascularity are normal. No adenopathy. No evident bone lesions. IMPRESSION: No edema or consolidation. Electronically Signed   By: Bretta BangWilliam  Woodruff III M.D.   On: 04/02/2017 17:27    Procedures Procedures (including critical care time)  Medications Ordered in UC Medications - No data to display   Initial Impression / Assessment and Plan / UC Course  I have reviewed the triage vital signs and the nursing notes.  Pertinent labs & imaging results that were available during my care of the patient were reviewed by me and considered in my medical decision making (see chart for details).    71 year old female presents with chronic cough.  Appears to have chronic bronchitis.  She has went from acute to chronic.  Has been going on for 3 months.  Treating with albuterol, Pulmicort, Tussionex.  The patient needs to call her primary as if it continues she will need to see pulmonology.  Final Clinical Impressions(s) / UC Diagnoses   Final diagnoses:  Simple chronic bronchitis [facility](HCC)    ED Discharge Orders        Ordered    albuterol (PROVENTIL HFA;VENTOLIN HFA) 108 (90 Base) MCG/ACT inhaler  Every 6 hours PRN     04/02/17 1734    chlorpheniramine-HYDROcodone (TUSSIONEX PENNKINETIC ER) 10-8 MG/5ML SUER  Every 12 hours PRN     04/02/17 1734    budesonide (PULMICORT) 180 MCG/ACT inhaler  2 times daily     04/02/17 1734     Controlled Substance Prescriptions [place] Controlled Substance Registry consulted? Not Applicable   Tommie SamsCook, Jaysiah Marchetta G, DO 04/02/17 1738

## 2017-04-02 NOTE — ED Triage Notes (Signed)
Patient in today c/o 3 month history of productive cough (green). Patient had a fever in the beginning and has been on 2 different abx and steroids.

## 2017-06-10 ENCOUNTER — Ambulatory Visit
Admission: EM | Admit: 2017-06-10 | Discharge: 2017-06-10 | Disposition: A | Discharge status: Home / Self Care | Payer: Medicare Other | Attending: Family Medicine | Admitting: Family Medicine

## 2017-06-10 ENCOUNTER — Encounter: Payer: Self-pay | Admitting: *Deleted

## 2017-06-10 DIAGNOSIS — H5712 Ocular pain, left eye: Secondary | ICD-10-CM

## 2017-06-10 DIAGNOSIS — R05 Cough: Secondary | ICD-10-CM

## 2017-06-10 DIAGNOSIS — R053 Chronic cough: Secondary | ICD-10-CM

## 2017-06-10 MED ORDER — OLOPATADINE HCL 0.2 % OP SOLN: OPHTHALMIC | 0 refills | Status: DC

## 2017-06-10 NOTE — ED Provider Notes (Signed)
[facility]  CSN: [id number] Arrival date & time: 06/10/17  1523  History   Chief Complaint Chief Complaint  Patient presents with  . Cough  . Eye Pain   HPI  72 year old female presents with chronic cough and eye pain.  Patient has ongoing chronic cough.  This is been an issue for quite some time.  She has seen me for this as well as her primary care physician.  She is currently scheduled to see ENT later this month.  She continues to be bothered by this.  Patient reports a 2-day history of left eye pain and itching/burning.  No drainage.  No eye redness.  No reports of vision changes.  No known exacerbating or relieving factors.  No other associated symptoms.  No other complaints.  Past Medical History:  Diagnosis Date  . DVT (deep venous thrombosis) (HCC)    Past Surgical History:  Procedure Laterality Date  . ABDOMINAL HYSTERECTOMY    . BACK SURGERY     OB History    No data available     Home Medications    Prior to Admission medications   Medication Sig Start Date End Date Taking? Authorizing Provider  conjugated estrogens (PREMARIN) vaginal cream Place 1 Applicatorful vaginally daily.   Yes [provider]  albuterol (PROVENTIL HFA;VENTOLIN HFA) 108 (90 Base) MCG/ACT inhaler Inhale 1-2 puffs into the lungs every 6 (six) hours as needed for wheezing or shortness of breath. 04/02/17   Everlene Other G, DO  budesonide (PULMICORT) 180 MCG/ACT inhaler Inhale 1 puff into the lungs 2 (two) times daily. 04/02/17   Tommie Sams, DO  Olopatadine HCl 0.2 % SOLN 1 drop to affected eye daily. 06/10/17   Tommie Sams, DO   Family History Family History  Problem Relation Age of Onset  . COPD Mother   . Cirrhosis Father        alcoholic  . Diabetes Father   . Asthma Father    Social History Social History   Tobacco Use  . Smoking status: Never Smoker  . Smokeless tobacco: Never Used  Substance Use Topics  . Alcohol use: No  . Drug use: No    Allergies   Patient has no known allergies.  Review of Systems Review of Systems  Eyes: Positive for pain and itching.  Respiratory: Positive for cough.    Physical Exam Triage Vital Signs ED Triage Vitals  Enc Vitals Group     BP 06/10/17 1539 135/75     Pulse Rate 06/10/17 1539 69     Resp 06/10/17 1539 16     Temp 06/10/17 1539 97.7 F (36.5 C)     Temp Source 06/10/17 1539 Oral     SpO2 06/10/17 1539 98 %     Weight 06/10/17 1541 200 lb (90.7 kg)     Height 06/10/17 1541 5\' 7"  (1.702 m)     Head Circumference --      Peak Flow --      Pain Score --      Pain Loc --      Pain Edu? --      Excl. in GC? --    Updated Vital Signs BP 135/75 (BP Location: Left Arm)   Pulse 69   Temp 97.7 F (36.5 C) (Oral)   Resp 16   Ht 5\' 7"  (1.702 m)   Wt 200 lb (90.7 kg)   SpO2 98%   BMI 31.32 kg/m   Visual Acuity  Right Eye Distance: 20/25 Left Eye Distance: 20/25 Bilateral Distance: 20/25  Right Eye Near:   Left Eye Near:    Bilateral Near:     Physical Exam  Constitutional: She is oriented to person, place, and time. She appears well-developed. No distress.  HENT:  Head: Normocephalic and atraumatic.  Eyes: Conjunctivae and EOM are normal. Pupils are equal, round, and reactive to light. Left eye exhibits no discharge.  Cardiovascular: Normal rate and regular rhythm.  Pulmonary/Chest: Effort normal and breath sounds normal. She has no wheezes. She has no rales.  Neurological: She is alert and oriented to person, place, and time.  Psychiatric: She has a normal mood and affect. Her behavior is normal.  Nursing note and vitals reviewed.  UC Treatments / Results  Labs (all labs ordered are listed, but only abnormal results are displayed) Labs Reviewed - No data to display  EKG  EKG Interpretation None       Radiology No results found.  Procedures Procedures (including critical care time)  Medications Ordered in UC Medications - No data to  display   Initial Impression / Assessment and Plan / UC Course  I have reviewed the triage vital signs and the nursing notes.  Pertinent labs & imaging results that were available during my care of the patient were reviewed by me and considered in my medical decision making (see chart for details).     72 year old female presents with chronic cough and eye pain.  Exam unremarkable.  I advised her that she needs to see her ENT physician and likely pulmonology.  I gave her the information of a local pulmonologist.  Regarding her eye pain, her exam is unremarkable.  She is describing burning and irritation.  Treating with Pataday.  Final Clinical Impressions(s) / UC Diagnoses   Final diagnoses:  Chronic cough  Pain of left eye    ED Discharge Orders        Ordered    Olopatadine HCl 0.2 % SOLN     06/10/17 1558     Controlled Substance Prescriptions [place] Controlled Substance Registry consulted? Not Applicable   Tommie SamsCook, Val Farnam G, DO 06/10/17 1614

## 2017-06-10 NOTE — ED Triage Notes (Signed)
Productive cough- white, since August 2018. Has seen numerous providers regarding this but cough remains persistent. Also, onset of left eye pain x3 days ago.

## 2017-06-10 NOTE — Discharge Instructions (Signed)
Call your primary about seeing pulmonology.  Keep ENT appt.  Eye drop as prescribed.  Take care  Dr. Adriana Simasook

## 2018-11-29 ENCOUNTER — Other Ambulatory Visit: Payer: Self-pay | Admitting: Family Medicine

## 2018-11-29 DIAGNOSIS — Z780 Asymptomatic menopausal state: Secondary | ICD-10-CM

## 2018-12-27 ENCOUNTER — Other Ambulatory Visit: Payer: Self-pay | Admitting: Family Medicine

## 2018-12-27 DIAGNOSIS — Z1231 Encounter for screening mammogram for malignant neoplasm of breast: Secondary | ICD-10-CM

## 2019-01-12 ENCOUNTER — Ambulatory Visit: Payer: Medicare Other

## 2019-01-12 ENCOUNTER — Inpatient Hospital Stay: Admission: RE | Admit: 2019-01-12 | Payer: Medicare Other | Source: Ambulatory Visit

## 2019-03-02 ENCOUNTER — Other Ambulatory Visit: Payer: Self-pay

## 2019-03-02 ENCOUNTER — Encounter (INDEPENDENT_AMBULATORY_CARE_PROVIDER_SITE_OTHER): Payer: Self-pay

## 2019-03-02 ENCOUNTER — Ambulatory Visit
Admission: RE | Admit: 2019-03-02 | Discharge: 2019-03-02 | Disposition: A | Discharge status: Home / Self Care | Payer: Medicare Other | Source: Ambulatory Visit | Attending: Family Medicine | Admitting: Family Medicine

## 2019-03-02 DIAGNOSIS — Z780 Asymptomatic menopausal state: Secondary | ICD-10-CM | POA: Insufficient documentation

## 2019-03-02 DIAGNOSIS — Z1231 Encounter for screening mammogram for malignant neoplasm of breast: Secondary | ICD-10-CM | POA: Diagnosis present

## 2019-03-14 ENCOUNTER — Other Ambulatory Visit: Payer: Self-pay

## 2019-03-14 ENCOUNTER — Ambulatory Visit
Admission: EM | Admit: 2019-03-14 | Discharge: 2019-03-14 | Disposition: A | Discharge status: Home / Self Care | Payer: Medicare Other | Attending: Family Medicine | Admitting: Family Medicine

## 2019-03-14 DIAGNOSIS — B349 Viral infection, unspecified: Secondary | ICD-10-CM | POA: Diagnosis not present

## 2019-03-14 DIAGNOSIS — Z86718 Personal history of other venous thrombosis and embolism: Secondary | ICD-10-CM | POA: Insufficient documentation

## 2019-03-14 DIAGNOSIS — H9209 Otalgia, unspecified ear: Secondary | ICD-10-CM | POA: Insufficient documentation

## 2019-03-14 DIAGNOSIS — R05 Cough: Secondary | ICD-10-CM | POA: Insufficient documentation

## 2019-03-14 DIAGNOSIS — R0981 Nasal congestion: Secondary | ICD-10-CM | POA: Diagnosis not present

## 2019-03-14 DIAGNOSIS — Z20828 Contact with and (suspected) exposure to other viral communicable diseases: Secondary | ICD-10-CM | POA: Diagnosis not present

## 2019-03-14 DIAGNOSIS — Z79899 Other long term (current) drug therapy: Secondary | ICD-10-CM | POA: Insufficient documentation

## 2019-03-14 MED ORDER — PREDNISONE 20 MG PO TABS: 20.0000 mg | ORAL_TABLET | Freq: Every day | ORAL | 0 refills | Status: DC

## 2019-03-14 NOTE — ED Triage Notes (Signed)
Pt with mild cough, nasal congestion and left ear pain. Congestion and cough x past 6 days

## 2019-03-14 NOTE — Discharge Instructions (Signed)
Rest, fluids, over the counter medications °

## 2019-03-14 NOTE — ED Provider Notes (Signed)
[facility]    CSN: [id number] Arrival date & time: 03/14/19  0807      History   Chief Complaint Chief Complaint  Patient presents with  . Otalgia  . Nasal Congestion    HPI Brenda Mathews is a 72 y.o. female.   73 yo female with a c/o nasal congestion, left ear pain and mild cough for the past 5-6 days. Denies any fevers, chills, chest pain, shortness of breath. No known sick contacts.    Otalgia   Past Medical History:  Diagnosis Date  . DVT (deep venous thrombosis) ([place])     There are no active problems to display for this patient.   Past Surgical History:  Procedure Laterality Date  . ABDOMINAL HYSTERECTOMY    . BACK SURGERY      OB History   No obstetric history on file.      Home Medications    Prior to Admission medications   Medication Sig Start Date End Date Taking? Authorizing Provider  fluticasone (FLONASE) 50 MCG/ACT nasal spray Place into both nostrils daily.   Yes [provider]  albuterol (PROVENTIL HFA;VENTOLIN HFA) 108 (90 Base) MCG/ACT inhaler Inhale 1-2 puffs into the lungs every 6 (six) hours as needed for wheezing or shortness of breath. 04/02/17   Thersa Salt G, DO  budesonide (PULMICORT) 180 MCG/ACT inhaler Inhale 1 puff into the lungs 2 (two) times daily. 04/02/17   Coral Spikes, DO  conjugated estrogens (PREMARIN) vaginal cream Place 1 Applicatorful vaginally daily.    [provider]  Olopatadine HCl 0.2 % SOLN 1 drop to affected eye daily. 06/10/17   Coral Spikes, DO  predniSONE (DELTASONE) 20 MG tablet Take 1 tablet (20 mg total) by mouth daily. 03/14/19   Norval Gable, MD    Family History Family History  Problem Relation Age of Onset  . COPD Mother   . Cirrhosis Father        alcoholic  . Diabetes Father   . Asthma Father   . Breast cancer Neg Hx     Social History Social History   Tobacco Use  . Smoking status: Never Smoker  . Smokeless tobacco: Never Used  Substance Use  Topics  . Alcohol use: No  . Drug use: No     Allergies   Patient has no known allergies.   Review of Systems Review of Systems  HENT: Positive for ear pain.      Physical Exam Triage Vital Signs ED Triage Vitals  Enc Vitals Group     BP 03/14/19 0821 135/88     Pulse Rate 03/14/19 0821 73     Resp 03/14/19 0821 16     Temp 03/14/19 0821 98.1 F (36.7 C)     Temp Source 03/14/19 0821 Oral     SpO2 03/14/19 0821 100 %     Weight 03/14/19 0823 200 lb (90.7 kg)     Height 03/14/19 0823 5\' 7"  (1.702 m)     Head Circumference --      Peak Flow --      Pain Score 03/14/19 0823 2     Pain Loc --      Pain Edu? --      Excl. in [place]? --    No data found.  Updated Vital Signs BP 135/88 (BP Location: Left Arm)   Pulse 73   Temp 98.1 F (36.7 C) (Oral)   Resp 16   Ht 5\' 7"  (1.702 m)  Wt 90.7 kg   SpO2 100%   BMI 31.32 kg/m   Visual Acuity Right Eye Distance:   Left Eye Distance:   Bilateral Distance:    Right Eye Near:   Left Eye Near:    Bilateral Near:     Physical Exam Vitals signs and nursing note reviewed.  Constitutional:      General: She is not in acute distress.    Appearance: She is not toxic-appearing or diaphoretic.  HENT:     Right Ear: Tympanic membrane normal.     Left Ear: Tympanic membrane normal.     Nose: Congestion and rhinorrhea present.  Cardiovascular:     Rate and Rhythm: Normal rate.  Pulmonary:     Effort: Pulmonary effort is normal. No respiratory distress.     Breath sounds: Normal breath sounds.  Neurological:     Mental Status: She is alert.      UC Treatments / Results  Labs (all labs ordered are listed, but only abnormal results are displayed) Labs Reviewed  NOVEL CORONAVIRUS, NAA (HOSP ORDER, SEND-OUT TO REF LAB; TAT 18-24 HRS)    EKG   Radiology No results found.  Procedures Procedures (including critical care time)  Medications Ordered in UC Medications - No data to display  Initial Impression /  Assessment and Plan / UC Course  I have reviewed the triage vital signs and the nursing notes.  Pertinent labs & imaging results that were available during my care of the patient were reviewed by me and considered in my medical decision making (see chart for details).      Final Clinical Impressions(s) / UC Diagnoses   Final diagnoses:  Viral syndrome  Nasal congestion     Discharge Instructions     Rest, fluids, over the counter medications    ED Prescriptions    Medication Sig Dispense Auth. Provider   predniSONE (DELTASONE) 20 MG tablet Take 1 tablet (20 mg total) by mouth daily. 5 tablet Payton Mccallum, MD      1. diagnosis reviewed with patient 2. rx as per orders above; reviewed possible side effects, interactions, risks and benefits  3. Recommend supportive treatment as above 4. Follow-up prn if symptoms worsen or don't improve PDMP not reviewed this encounter.   Payton Mccallum, MD 03/14/19 1241

## 2019-03-15 LAB — NOVEL CORONAVIRUS, NAA (HOSP ORDER, SEND-OUT TO REF LAB; TAT 18-24 HRS): SARS-CoV-2, NAA: NOT DETECTED

## 2019-05-29 ENCOUNTER — Encounter: Payer: Self-pay | Admitting: Emergency Medicine

## 2019-05-29 ENCOUNTER — Other Ambulatory Visit: Payer: Self-pay

## 2019-05-29 ENCOUNTER — Ambulatory Visit
Admission: EM | Admit: 2019-05-29 | Discharge: 2019-05-29 | Disposition: A | Discharge status: Home / Self Care | Payer: Medicare HMO | Attending: Family Medicine | Admitting: Family Medicine

## 2019-05-29 DIAGNOSIS — R519 Headache, unspecified: Secondary | ICD-10-CM | POA: Diagnosis not present

## 2019-05-29 DIAGNOSIS — R42 Dizziness and giddiness: Secondary | ICD-10-CM | POA: Diagnosis present

## 2019-05-29 DIAGNOSIS — R067 Sneezing: Secondary | ICD-10-CM | POA: Diagnosis not present

## 2019-05-29 DIAGNOSIS — Z20822 Contact with and (suspected) exposure to covid-19: Secondary | ICD-10-CM | POA: Insufficient documentation

## 2019-05-29 DIAGNOSIS — R110 Nausea: Secondary | ICD-10-CM | POA: Diagnosis not present

## 2019-05-29 DIAGNOSIS — Z79899 Other long term (current) drug therapy: Secondary | ICD-10-CM | POA: Diagnosis not present

## 2019-05-29 DIAGNOSIS — R6883 Chills (without fever): Secondary | ICD-10-CM | POA: Insufficient documentation

## 2019-05-29 DIAGNOSIS — Z86718 Personal history of other venous thrombosis and embolism: Secondary | ICD-10-CM | POA: Insufficient documentation

## 2019-05-29 LAB — CBC WITH DIFFERENTIAL/PLATELET
Abs Immature Granulocytes: 0.02 10*3/uL (ref 0.00–0.07)
Basophils Absolute: 0 10*3/uL (ref 0.0–0.1)
Basophils Relative: 1 %
Eosinophils Absolute: 0.1 10*3/uL (ref 0.0–0.5)
Eosinophils Relative: 1 %
HCT: 41.4 % (ref 36.0–46.0)
Hemoglobin: 13.8 g/dL (ref 12.0–15.0)
Immature Granulocytes: 0 %
Lymphocytes Relative: 26 %
Lymphs Abs: 1.5 10*3/uL (ref 0.7–4.0)
MCH: 29.7 pg (ref 26.0–34.0)
MCHC: 33.3 g/dL (ref 30.0–36.0)
MCV: 89 fL (ref 80.0–100.0)
Monocytes Absolute: 0.3 10*3/uL (ref 0.1–1.0)
Monocytes Relative: 6 %
Neutro Abs: 3.7 10*3/uL (ref 1.7–7.7)
Neutrophils Relative %: 66 %
Platelets: 307 10*3/uL (ref 150–400)
RBC: 4.65 MIL/uL (ref 3.87–5.11)
RDW: 13 % (ref 11.5–15.5)
WBC: 5.6 10*3/uL (ref 4.0–10.5)
nRBC: 0 % (ref 0.0–0.2)

## 2019-05-29 LAB — COMPREHENSIVE METABOLIC PANEL
ALT: 17 U/L (ref 0–44)
AST: 21 U/L (ref 15–41)
Albumin: 3.9 g/dL (ref 3.5–5.0)
Alkaline Phosphatase: 58 U/L (ref 38–126)
Anion gap: 4 — ABNORMAL LOW (ref 5–15)
BUN: 17 mg/dL (ref 8–23)
CO2: 28 mmol/L (ref 22–32)
Calcium: 8.8 mg/dL — ABNORMAL LOW (ref 8.9–10.3)
Chloride: 106 mmol/L (ref 98–111)
Creatinine, Ser: 0.6 mg/dL (ref 0.44–1.00)
GFR calc Af Amer: >60 mL/min (ref >60)
GFR calc non Af Amer: >60 mL/min (ref >60)
Glucose, Bld: 124 mg/dL — ABNORMAL HIGH (ref 70–99)
Potassium: 3.7 mmol/L (ref 3.5–5.1)
Sodium: 138 mmol/L (ref 135–145)
Total Bilirubin: 0.7 mg/dL (ref 0.3–1.2)
Total Protein: 6.8 g/dL (ref 6.5–8.1)

## 2019-05-29 LAB — SEDIMENTATION RATE: Sed Rate: 14 mm/hr (ref 0–30)

## 2019-05-29 MED ORDER — MECLIZINE HCL 25 MG PO TABS: 25.0000 mg | ORAL_TABLET | Freq: Three times a day (TID) | ORAL | 0 refills | Status: DC | PRN

## 2019-05-29 MED ORDER — PROMETHAZINE HCL 12.5 MG PO TABS: 12.5000 mg | ORAL_TABLET | Freq: Three times a day (TID) | ORAL | 0 refills | Status: DC | PRN

## 2019-05-29 NOTE — ED Triage Notes (Signed)
Patient c/o dizziness that started 2-3 days ago.  Patient denies any pain.

## 2019-05-29 NOTE — ED Provider Notes (Signed)
[facility]    CSN: [id number] Arrival date & time: 05/29/19  1226      History   Chief Complaint Chief Complaint  Patient presents with  . Dizziness    HPI Brenda Mathews is a 74 y.o. female.   74 year old female presents with dizziness that started at work about 3 to 4 days ago. She works at [employer] and began to notice she felt more lightheaded and dizzy with movement of her head. Also having a headache- mainly frontal but occasional left temporal area. Denies any fever but has had chills and sweats along with nausea. No vomiting. Has felt that vision is more blurred. Also has been sneezing and some nasal drainage. Denies any cough, chest pain, palpitations, difficulty breathing, abdominal pain or numbness. Recently fractured her left hip and history of seasonal allergies. Currently on Calcium and vit D daily and Zyrtec and Sudafed prn. Also took Tylenol yesterday for headache with minimal relief. Has history of vertigo and migraine headaches in the past but this episode feels a little different. Dizziness has improved slightly while at Urgent Care but headache has worsened. No known exposure to COVID 19.   The history is provided by the patient.    Past Medical History:  Diagnosis Date  . DVT (deep venous thrombosis) (HCC)     There are no problems to display for this patient.   Past Surgical History:  Procedure Laterality Date  . ABDOMINAL HYSTERECTOMY    . BACK SURGERY      OB History   No obstetric history on file.      Home Medications    Prior to Admission medications   Medication Sig Start Date End Date Taking? Authorizing Provider  cetirizine (ZYRTEC) 10 MG tablet Take by mouth. 02/24/19  Yes [provider]  Cholecalciferol 250 MCG (10000 UT) CAPS Take by mouth.   Yes [provider]  meclizine (ANTIVERT) 25 MG tablet Take 1 tablet (25 mg total) by mouth 3 (three) times daily as needed for dizziness. 05/29/19   Sudie Grumbling, NP  promethazine (PHENERGAN) 12.5 MG tablet Take 1 tablet (12.5 mg total) by mouth every 8 (eight) hours as needed for nausea. 05/29/19   Sudie Grumbling, NP  albuterol (PROVENTIL HFA;VENTOLIN HFA) 108 (90 Base) MCG/ACT inhaler Inhale 1-2 puffs into the lungs every 6 (six) hours as needed for wheezing or shortness of breath. 04/02/17 05/29/19  Tommie Sams, DO  budesonide (PULMICORT) 180 MCG/ACT inhaler Inhale 1 puff into the lungs 2 (two) times daily. 04/02/17 05/29/19  Tommie Sams, DO    Family History Family History  Problem Relation Age of Onset  . COPD Mother   . Cirrhosis Father        alcoholic  . Diabetes Father   . Asthma Father   . Breast cancer Neg Hx     Social History Social History   Tobacco Use  . Smoking status: Never Smoker  . Smokeless tobacco: Never Used  Substance Use Topics  . Alcohol use: No  . Drug use: No     Allergies   Patient has no known allergies.   Review of Systems Review of Systems  Constitutional: Positive for chills and fatigue. Negative for appetite change, diaphoresis and fever.  HENT: Positive for postnasal drip and sneezing. Negative for congestion, ear discharge, ear pain, facial swelling, mouth sores, sinus pressure, sinus pain, sore throat and trouble swallowing.   Eyes: Positive for visual disturbance (slight  blurred vision). Negative for photophobia, pain, discharge and redness.  Respiratory: Negative for cough, chest tightness, shortness of breath and wheezing.   Cardiovascular: Negative for chest pain and palpitations.  Gastrointestinal: Positive for nausea. Negative for abdominal pain and vomiting.  Musculoskeletal: Negative for arthralgias, myalgias, neck pain and neck stiffness.  Skin: Negative for color change, rash and wound.  Allergic/Immunologic: Positive for environmental allergies. Negative for food allergies and immunocompromised state.  Neurological: Positive for dizziness, light-headedness and headaches.  Negative for tremors, seizures, syncope, facial asymmetry, speech difficulty, weakness and numbness.  Hematological: Negative for adenopathy. Does not bruise/bleed easily.     Physical Exam Triage Vital Signs ED Triage Vitals  Enc Vitals Group     BP 05/29/19 1239 (!) 141/85     Pulse Rate 05/29/19 1239 70     Resp 05/29/19 1239 14     Temp 05/29/19 1239 97.8 F (36.6 C)     Temp Source 05/29/19 1239 Oral     SpO2 05/29/19 1239 100 %     Weight 05/29/19 1234 200 lb (90.7 kg)     Height 05/29/19 1234 5\' 6"  (1.676 m)     Head Circumference --      Peak Flow --      Pain Score 05/29/19 1234 0     Pain Loc --      Pain Edu? --      Excl. in [place]? --    No data found.  Updated Vital Signs BP (!) 141/85 (BP Location: Right Arm)   Pulse 70   Temp 97.8 F (36.6 C) (Oral)   Resp 14   Ht 5\' 6"  (1.676 m)   Wt 200 lb (90.7 kg)   SpO2 100%   BMI 32.28 kg/m   Visual Acuity Right Eye Distance:   Left Eye Distance:   Bilateral Distance:    Right Eye Near:   Left Eye Near:    Bilateral Near:   20/20   Physical Exam Vitals and nursing note reviewed.  Constitutional:      General: She is awake. She is not in acute distress.    Appearance: She is well-developed and well-groomed.     Comments: Patient sitting comfortably in exam chair in no acute distress but appears to be in pain and closing her eyes frequently.   HENT:     Head: Normocephalic. No contusion, masses, right periorbital erythema or left periorbital erythema.     Comments: Slightly tender on frontal and left temporal area of scalp.     Right Ear: Hearing, tympanic membrane, ear canal and external ear normal.     Left Ear: Hearing, tympanic membrane, ear canal and external ear normal.     Nose: Nose normal.     Right Sinus: No maxillary sinus tenderness or frontal sinus tenderness.     Left Sinus: No maxillary sinus tenderness or frontal sinus tenderness.     Mouth/Throat:     Lips: Pink.     Mouth: Mucous  membranes are moist.     Pharynx: Oropharynx is clear. Uvula midline. No oropharyngeal exudate or posterior oropharyngeal erythema.  Eyes:     General: Vision grossly intact. No visual field deficit.       Right eye: No discharge.        Left eye: No discharge.     Extraocular Movements: Extraocular movements intact.     Conjunctiva/sclera: Conjunctivae normal.     Pupils: Pupils are equal, round, and reactive to light.  Comments: Upper lids droop slightly bilaterally but evenly.   Neck:     Vascular: Normal carotid pulses. No carotid bruit, hepatojugular reflux or JVD.     Trachea: Trachea normal.  Cardiovascular:     Rate and Rhythm: Normal rate and regular rhythm.     Pulses: Normal pulses.     Heart sounds: Normal heart sounds. No murmur.  Pulmonary:     Effort: Pulmonary effort is normal. No respiratory distress.     Breath sounds: Normal breath sounds and air entry. No stridor or decreased air movement. No decreased breath sounds, wheezing, rhonchi or rales.  Musculoskeletal:        General: Normal range of motion.     Cervical back: Normal range of motion and neck supple. No rigidity or tenderness.  Lymphadenopathy:     Cervical: No cervical adenopathy.  Skin:    General: Skin is warm and dry.     Capillary Refill: Capillary refill takes less than 2 seconds.     Findings: No rash.  Neurological:     General: No focal deficit present.     Mental Status: She is alert and oriented to person, place, and time.     Cranial Nerves: Cranial nerves are intact. No facial asymmetry.     Sensory: Sensation is intact.     Motor: Motor function is intact.     Coordination: Coordination is intact.     Gait: Tandem walk abnormal.     Comments: Slight imbalance with tandem walk  Psychiatric:        Mood and Affect: Mood normal.        Behavior: Behavior normal. Behavior is cooperative.        Thought Content: Thought content normal.        Judgment: Judgment normal.      UC  Treatments / Results  Labs (all labs ordered are listed, but only abnormal results are displayed) Labs Reviewed  COMPREHENSIVE METABOLIC PANEL - Abnormal; Notable for the following components:      Result Value   Glucose, Bld 124 (*)    Calcium 8.8 (*)    Anion gap 4 (*)    All other components within normal limits  NOVEL CORONAVIRUS, NAA (HOSP ORDER, SEND-OUT TO REF LAB; TAT 18-24 HRS)  CBC WITH DIFFERENTIAL/PLATELET  SEDIMENTATION RATE    EKG   Radiology No results found.  Procedures Procedures (including critical care time)  Medications Ordered in UC Medications - No data to display  Initial Impression / Assessment and Plan / UC Course  I have reviewed the triage vital signs and the nursing notes.  Pertinent labs & imaging results that were available during my care of the patient were reviewed by me and considered in my medical decision making (see chart for details).    Reviewed normal CBC and Sed rate with patient. Reviewed essentially normal Chem profile results except slightly elevated glucose level. Reviewed various etiologies for current symptoms including possible COVID 19 infection. May be having some vertigo but also variant of a migraine headache. Patient is stable and dizziness has improved during stay but headache has worsened. Discussed possible medication to help with symptoms. May trial Meclizine since she has taken this before for vertigo with some success. May use 25mg  every 8 hours as needed. Patient desires medication for nausea that will cause drowsiness to help her sleep. She has taken Phenergan in the past with good success and no other side effects. May take Phenergan 12.5mg  every  8 hours as needed for nausea- do not take at same time as Meclizine (separate by at least 4 hours). May take Tylenol 1000mg  every 8 hours as needed for headache. Continue to push fluids to avoid dehydration. Rest. Stay at home until results of COVID 19 test is available and  symptoms have improved. Discussed possible need for further work up including CT or MRI imaging of head. If dizziness, headache and nausea persist or worsen within the next 24 to 48 hours, go the ER ASAP. Otherwise, follow-up in 3 to 4 days with her PCP for recheck.  Final Clinical Impressions(s) / UC Diagnoses   Final diagnoses:  Dizziness  Frontal headache  Chills  Nausea without vomiting     Discharge Instructions     Recommend start Meclizine 25mg  every 8 hours as needed for dizziness. May also take Phenergan 1 tablet every 8 hours as needed for nausea- do not take Meclizine and Phenergan at the same time- may cause drowsiness. May also take Tylenol 1000mg  every 8 hours as needed for headache. Continue to push fluids to avoid dehydration. Rest. Stay at home until results of COVID 19 is available. If dizziness, headache and nausea persist or worsen within the next 24 to 48 hours, go to the ER ASAP. Otherwise, follow-up in 3 to 4 days with your PCP for recheck.     ED Prescriptions    Medication Sig Dispense Auth. Provider   meclizine (ANTIVERT) 25 MG tablet Take 1 tablet (25 mg total) by mouth 3 (three) times daily as needed for dizziness. 20 tablet , NP   promethazine (PHENERGAN) 12.5 MG tablet Take 1 tablet (12.5 mg total) by mouth every 8 (eight) hours as needed for nausea. 15 tablet Javonta Gronau, , NP     PDMP not reviewed this encounter.   , NP 05/29/19 [phone]

## 2019-05-29 NOTE — Discharge Instructions (Addendum)
Recommend start Meclizine 25mg  every 8 hours as needed for dizziness. May also take Phenergan 1 tablet every 8 hours as needed for nausea- do not take Meclizine and Phenergan at the same time- may cause drowsiness. May also take Tylenol 1000mg  every 8 hours as needed for headache. Continue to push fluids to avoid dehydration. Rest. Stay at home until results of COVID 19 is available. If dizziness, headache and nausea persist or worsen within the next 24 to 48 hours, go to the ER ASAP. Otherwise, follow-up in 3 to 4 days with your PCP for recheck.

## 2019-05-30 LAB — NOVEL CORONAVIRUS, NAA (HOSP ORDER, SEND-OUT TO REF LAB; TAT 18-24 HRS): SARS-CoV-2, NAA: NOT DETECTED

## 2020-05-29 ENCOUNTER — Ambulatory Visit (INDEPENDENT_AMBULATORY_CARE_PROVIDER_SITE_OTHER): Payer: Medicare HMO

## 2020-05-29 ENCOUNTER — Ambulatory Visit
Admission: EM | Admit: 2020-05-29 | Discharge: 2020-05-29 | Disposition: A | Discharge status: Home / Self Care | Payer: Medicare HMO | Attending: Emergency Medicine | Admitting: Emergency Medicine

## 2020-05-29 ENCOUNTER — Other Ambulatory Visit: Payer: Self-pay

## 2020-05-29 DIAGNOSIS — M7731 Calcaneal spur, right foot: Secondary | ICD-10-CM

## 2020-05-29 DIAGNOSIS — M79671 Pain in right foot: Secondary | ICD-10-CM | POA: Diagnosis not present

## 2020-05-29 NOTE — ED Provider Notes (Signed)
HPI  SUBJECTIVE:  Brenda Mathews is a 74 y.o. female who presents with throbbing, sharp, constant right heel pain for the past month.  It occasionally radiates up the back of her leg.  States that she was wearing flat, soft boots, but changed into danskos for better support.  She states that this sharp right heel pain started several minutes later and has not resolved despite changing her shoes back.  She has tried 4 days of rest, ice, 200 to 600 mg of ibuprofen as needed, compression socks and ankle compression stocking.  Rest, ibuprofen and compression help.  Symptoms are worse with walking, standing for prolonged periods of time at work and with heel strike.  She denies bruising, erythema, swelling, arch pain, trauma.  She states the rest of her foot and ankle are without pain or injury.  She saw her PMD for 2/7 and was found to have an Achilles tendinitis/foot pain.  Plan was to follow-up with her PMD if symptoms did not improve and imaging was to be done at that time.  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  She is currently not on any anticoagulants.  no history of diabetes, neuropathy, chronic kidney disease, osteoporosis.  [id number], Marylin Crosby, MD   Past Medical History:  Diagnosis Date  . DVT (deep venous thrombosis) (HCC)     Past Surgical History:  Procedure Laterality Date  . ABDOMINAL HYSTERECTOMY    . BACK SURGERY      Family History  Problem Relation Age of Onset  . COPD Mother   . Cirrhosis Father        alcoholic  . Diabetes Father   . Asthma Father   . Breast cancer Neg Hx     Social History   Tobacco Use  . Smoking status: Never Smoker  . Smokeless tobacco: Never Used  Vaping Use  . Vaping Use: Never used  Substance Use Topics  . Alcohol use: No  . Drug use: No    No current facility-administered medications for this encounter.  Current Outpatient Medications:  .  cetirizine (ZYRTEC) 10 MG tablet, Take  by mouth., Disp: , Rfl:  .  Cholecalciferol 250 MCG (10000 UT) CAPS, Take by mouth., Disp: , Rfl:  .  meclizine (ANTIVERT) 25 MG tablet, Take 1 tablet (25 mg total) by mouth 3 (three) times daily as needed for dizziness., Disp: 20 tablet, Rfl: 0 .  promethazine (PHENERGAN) 12.5 MG tablet, Take 1 tablet (12.5 mg total) by mouth every 8 (eight) hours as needed for nausea., Disp: 15 tablet, Rfl: 0  No Known Allergies   ROS  As noted in HPI.   Physical Exam  BP (!) 143/72 (BP Location: Right Arm)   Pulse 60   Temp 98.1 F (36.7 C) (Oral)   Resp 16   Wt 86.2 kg   SpO2 100%   BMI 30.67 kg/m   Constitutional: Well developed, well nourished, no acute distress Eyes:  EOMI, conjunctiva normal bilaterally HENT: Normocephalic, atraumatic,mucus membranes moist Respiratory: Normal inspiratory effort Cardiovascular: Normal rate GI: nondistended skin: No rash, skin intact Musculoskeletal. Right mid calcaneus tender.  Normal appearance.  Midfoot NT. Base of fifth metatarsal NT. No bruising. Skin intact. DP 2+. Sensation grossly intact. Patient able to move all toes actively.   no pain with inversion / eversion,  dorsiflexion / plantarflexion. No Tenderness along the plantar fascia. Distal fibula NT, Medial malleolus NT,  Deltoid ligament NT, Lateral ligaments  NT, Achilles NT. Patient able to bear weight while in department. Neurologic: Alert & oriented x 3, no focal neuro deficits Psychiatric: Speech and behavior appropriate   ED Course   Medications - No data to display  Orders Placed This Encounter  Procedures  . DG Foot Complete Right    Standing Status:   Standing    Number of Occurrences:   1    Order Specific Question:   Reason for Exam (SYMPTOM  OR DIAGNOSIS REQUIRED)    Answer:   Calcaneal pain.  . Ambulatory referral to Podiatry    Referral Priority:   Routine    Referral Type:   Consultation    Referral Reason:   Specialty Services Required    Requested Specialty:    Podiatry    Number of Visits Requested:   1    No results found for this or any previous visit (from the past 24 hour(s)). DG Foot Complete Right  Result Date: 05/29/2020 CLINICAL DATA:  Acute right heel pain. EXAM: RIGHT FOOT COMPLETE - 3+ VIEW COMPARISON:  None. FINDINGS: There is no evidence of fracture or dislocation. There is no evidence of arthropathy. Mild posterior calcaneal spurring is noted. Soft tissues are unremarkable. IMPRESSION: No acute abnormality seen in the right foot. Mild posterior calcaneal spurring. Electronically Signed   By: Lupita Raider M.D.   On: 05/29/2020 09:44    ED Clinical Impression  1. Bone spur of inferior portion of right calcaneus   2. Foot pain, right      ED Assessment/Plan  Outside records reviewed. Plan was to image the foot if not better with conservative therapy, rest when she saw her primary care physician on 2/7. Will x-ray foot today.  Reviewed imaging independently.  No acute abnormalities.  Mild posterior calcaneal spurring see radiology report for full details.  Suspect pain is coming from a bone spur.  Does not appear to be Achilles tendinitis.  There is no evidence of fracture on x-ray.  Given duration of symptoms, [facility], patient also may follow-up with Dr. Excell Seltzer on-call.  Advised 500 mg of Tylenol combined with 400 mg of ibuprofen 3-4 times a day as needed for pain for the next 4 to 5 days.    Discussed imaging, MDM, treatment plan, and plan for follow-up with patient.. patient agrees with plan.   No orders of the defined types were placed in this encounter.   *This clinic note was created using Dragon dictation software. Therefore, there may be occasional mistakes despite careful proofreading.   ?    Domenick Gong, MD 05/30/20 [phone]

## 2020-05-29 NOTE — Discharge Instructions (Addendum)
500 mg of Tylenol combined with 400 mg of ibuprofen 3-4 times a day as needed for pain for the next 4 to 5 days.  Follow-up with the [facility] [place] or with Dr. Excell Seltzer soon as you can.  You can try heel cups.

## 2020-05-29 NOTE — ED Triage Notes (Signed)
Patient presents to Urgent Care with complaints of right foot pain, pt reports standing for long hours at work so she constantly has to change shoes. She is unsure if pain is related to shoes. She states visit with PCP a week ago was instructed to wear compression socks for relief. Pt states no improvement continues to have foot pain. She has treated pain with ibuprofen and applied ice.     Denies any swelling or bruising to right foot.

## 2020-06-06 ENCOUNTER — Ambulatory Visit: Payer: Medicare HMO | Admitting: Podiatry

## 2020-06-06 ENCOUNTER — Encounter: Payer: Self-pay | Admitting: Podiatry

## 2020-06-06 ENCOUNTER — Ambulatory Visit: Payer: Medicare HMO

## 2020-06-06 ENCOUNTER — Other Ambulatory Visit: Payer: Self-pay

## 2020-06-06 DIAGNOSIS — M549 Dorsalgia, unspecified: Secondary | ICD-10-CM | POA: Insufficient documentation

## 2020-06-06 DIAGNOSIS — Z780 Asymptomatic menopausal state: Secondary | ICD-10-CM | POA: Insufficient documentation

## 2020-06-06 DIAGNOSIS — M722 Plantar fascial fibromatosis: Secondary | ICD-10-CM

## 2020-06-06 DIAGNOSIS — R200 Anesthesia of skin: Secondary | ICD-10-CM | POA: Insufficient documentation

## 2020-06-06 DIAGNOSIS — M542 Cervicalgia: Secondary | ICD-10-CM | POA: Insufficient documentation

## 2020-06-06 DIAGNOSIS — IMO0001 Reserved for inherently not codable concepts without codable children: Secondary | ICD-10-CM | POA: Insufficient documentation

## 2020-06-06 DIAGNOSIS — K449 Diaphragmatic hernia without obstruction or gangrene: Secondary | ICD-10-CM | POA: Insufficient documentation

## 2020-06-06 DIAGNOSIS — M5020 Other cervical disc displacement, unspecified cervical region: Secondary | ICD-10-CM | POA: Insufficient documentation

## 2020-06-06 MED ORDER — METHYLPREDNISOLONE 4 MG PO TBPK: ORAL_TABLET | ORAL | 0 refills | Status: DC

## 2020-06-06 MED ORDER — TRIAMCINOLONE ACETONIDE 40 MG/ML IJ SUSP
20.0000 mg | Freq: Once | INTRAMUSCULAR | Status: AC
  Administered 2020-06-06: 20 mg

## 2020-06-06 MED ORDER — MELOXICAM 15 MG PO TABS: 15.0000 mg | ORAL_TABLET | Freq: Every day | ORAL | 3 refills | Status: DC

## 2020-06-06 NOTE — Progress Notes (Signed)
  Subjective:  Patient ID: Brenda Mathews, female    DOB: Mar 05, 1946,  MRN: [id number] HPI Chief Complaint  Patient presents with  . Foot Pain    Plantar/medial heel right - aching, burning x 2 months, AM pain and now pain is more constant, tried Ibuprofen, ice and rest  . New Patient (Initial Visit)    75 y.o. female presents with the above complaint.   ROS: Denies fever chills nausea vomiting muscle aches pains calf pain back pain chest pain shortness of breath.  Past Medical History:  Diagnosis Date  . DVT (deep venous thrombosis) (HCC)    Past Surgical History:  Procedure Laterality Date  . ABDOMINAL HYSTERECTOMY    . BACK SURGERY      Current Outpatient Medications:  .  acetaminophen (TYLENOL) 500 MG tablet, Take 500 mg by mouth every 6 (six) hours as needed., Disp: , Rfl:  .  ibuprofen (ADVIL) 200 MG tablet, Take 200 mg by mouth every 6 (six) hours as needed., Disp: , Rfl:  .  meloxicam (MOBIC) 15 MG tablet, Take 1 tablet (15 mg total) by mouth daily., Disp: 30 tablet, Rfl: 3 .  methylPREDNISolone (MEDROL DOSEPAK) 4 MG TBPK tablet, 6 day dose pack - take as directed, Disp: 21 tablet, Rfl: 0 .  Cholecalciferol 250 MCG (10000 UT) CAPS, Take by mouth., Disp: , Rfl:   No Known Allergies Review of Systems Objective:  There were no vitals filed for this visit.  General: Well developed, nourished, in no acute distress, alert and oriented x3   Dermatological: Skin is warm, dry and supple bilateral. Nails x 10 are well maintained; remaining integument appears unremarkable at this time. There are no open sores, no preulcerative lesions, no rash or signs of infection present.  Vascular: Dorsalis Pedis artery and Posterior Tibial artery pedal pulses are 2/4 bilateral with immedate capillary fill time. Pedal hair growth present. No varicosities and no lower extremity edema present bilateral.   Neruologic: Grossly intact via light touch bilateral. Vibratory intact via tuning  fork bilateral. Protective threshold with Semmes Wienstein monofilament intact to all pedal sites bilateral. Patellar and Achilles deep tendon reflexes 2+ bilateral. No Babinski or clonus noted bilateral.   Musculoskeletal: No gross boney pedal deformities bilateral. No pain, crepitus, or limitation noted with foot and ankle range of motion bilateral. Muscular strength 5/5 in all groups tested bilateral.  Pain on palpation medial canthal tubercle right.  Gait: Unassisted, Nonantalgic.    Radiographs:  Radiographs previously taken reevaluated today from an outside source demonstrating of plantar distally or calcaneal spur posterior oriented calcaneal spur with soft tissue increase in density plantar fascial pain insertion site of the right heel.  Assessment & Plan:   Assessment: Planter fasciitis right.  Plan: We discussed appropriate shoe gear stretching exercise ice therapy and shoe gear modifications.  I injected the area today with Kenalog and local anesthetic started her on meloxicam 15 mg and placed her in a plantar fascial brace.  Follow-up with her in 1 month     Max T. [place], [place]

## 2020-06-06 NOTE — Patient Instructions (Signed)

## 2020-07-16 ENCOUNTER — Ambulatory Visit (INDEPENDENT_AMBULATORY_CARE_PROVIDER_SITE_OTHER): Payer: Medicare HMO | Admitting: Podiatry

## 2020-07-16 ENCOUNTER — Other Ambulatory Visit: Payer: Self-pay

## 2020-07-16 ENCOUNTER — Encounter: Payer: Self-pay | Admitting: Podiatry

## 2020-07-16 DIAGNOSIS — S93691A Other sprain of right foot, initial encounter: Secondary | ICD-10-CM

## 2020-07-16 NOTE — Progress Notes (Signed)
She presents today states that it did great for the first several days now the pain is back just like it was if not worse than it was.  Objective: Vital signs are stable alert oriented x3.  There is no erythema edema cellulitis drainage or odor.  She has severe pain on palpation Nupercaine tubercle of her right heel.  Assessment: Probable tear of the plantar fascia of the right foot.   Plan: Discussed etiology pathology conservative surgical therapies at this point time went ahead and requested an MRI of the rear foot evaluating for differential diagnosis and surgical intervention regarding probable tear of the plantar fascia.

## 2020-07-19 ENCOUNTER — Telehealth: Payer: Self-pay | Admitting: Podiatry

## 2020-07-19 DIAGNOSIS — S93691A Other sprain of right foot, initial encounter: Secondary | ICD-10-CM

## 2020-07-19 NOTE — Telephone Encounter (Signed)
Pt called requesting her MRI be done in [place] or [place].  She doesn't want to go to [facility].  Pls contact pt at home #.

## 2020-07-19 NOTE — Addendum Note (Signed)
Addended by: Geraldine Contras D on: 07/19/2020 12:21 PM   Modules accepted: Orders

## 2020-07-19 NOTE — Telephone Encounter (Signed)
Precert in progress.  New order has been entered for [facility]

## 2020-07-24 ENCOUNTER — Other Ambulatory Visit: Payer: Medicare HMO

## 2020-07-24 ENCOUNTER — Telehealth: Payer: Self-pay | Admitting: *Deleted

## 2020-07-24 NOTE — Telephone Encounter (Signed)
"  I was scheduled for a MRI in [place].  I told the nurse that I didn't want to go to [place].  She said she would refer me to [place].  I haven't heard anything yet."  I will let her know.

## 2020-07-26 ENCOUNTER — Other Ambulatory Visit: Payer: Self-pay | Admitting: Podiatry

## 2020-07-26 ENCOUNTER — Telehealth: Payer: Self-pay | Admitting: *Deleted

## 2020-07-26 MED ORDER — TRAMADOL HCL 50 MG PO TABS: 50.0000 mg | ORAL_TABLET | Freq: Three times a day (TID) | ORAL | 0 refills | Status: AC | PRN

## 2020-07-26 NOTE — Telephone Encounter (Signed)
"  Angie call me back."

## 2020-07-26 NOTE — Telephone Encounter (Signed)
Patient is calling to request another injection.Please advise.

## 2020-07-26 NOTE — Telephone Encounter (Signed)
Pt called in and stated that she cannot get in for her MRI until 08/07/2020 and would like to have pain medication sent in to [facility] as she is in tears trying to work with a pain level of 9 out of 10. She stated that she would really like to come in and get an injection but because there were no appointments available she would like medication to help ease the pain until she can have her MRI.

## 2020-07-27 NOTE — Telephone Encounter (Signed)
I notified patient via voice mail that Tramadol was sent to her pharmacy per Dr. Al Corpus and she can call back on Monday to schedule for another injection

## 2020-07-30 NOTE — Telephone Encounter (Signed)
Appeal has been faxed to [facility]  Fax# [phone]

## 2020-08-01 ENCOUNTER — Other Ambulatory Visit: Payer: Self-pay

## 2020-08-01 ENCOUNTER — Encounter: Payer: Self-pay | Admitting: Podiatry

## 2020-08-01 ENCOUNTER — Ambulatory Visit: Payer: Medicare HMO | Admitting: Podiatry

## 2020-08-01 DIAGNOSIS — M722 Plantar fascial fibromatosis: Secondary | ICD-10-CM

## 2020-08-01 MED ORDER — TRIAMCINOLONE ACETONIDE 40 MG/ML IJ SUSP
20.0000 mg | Freq: Once | INTRAMUSCULAR | Status: AC
  Administered 2020-08-01: 20 mg

## 2020-08-01 NOTE — Progress Notes (Signed)
She presents today she will be going back for MRI next week.  She is complaining of some severe heel pain right.  Objective: Vital signs are stable she is alert and oriented x3.  Pulses are palpable.  There is no erythema edema cellulitis drainage or odor.  Assessment: Severe pain on palpation medial calcaneal tubercle suspect tear of the plantar fascia right.  Plan: I injected dexamethasone local anesthetic today.  Also injected simultaneously Kenalog and local anesthetic and I will follow-up with her after her MRI and MRI.

## 2020-08-02 ENCOUNTER — Telehealth: Payer: Self-pay

## 2020-08-02 NOTE — Telephone Encounter (Signed)
Pre-service center called to see if a peer to peer is requested. Call back number is [phone] ext [phone].

## 2020-08-02 NOTE — Telephone Encounter (Signed)
Per Monia Pouch, MRI has been approved from 08/01/2020 to 01/31/2021 auth# [id number] Patient has been notified via voice mail

## 2020-08-03 NOTE — Telephone Encounter (Signed)
ERROR

## 2020-08-07 ENCOUNTER — Ambulatory Visit
Admission: RE | Admit: 2020-08-07 | Discharge: 2020-08-07 | Disposition: A | Discharge status: Home / Self Care | Payer: Medicare HMO | Source: Ambulatory Visit | Attending: Podiatry | Admitting: Podiatry

## 2020-08-07 ENCOUNTER — Telehealth: Payer: Self-pay

## 2020-08-07 ENCOUNTER — Other Ambulatory Visit: Payer: Self-pay

## 2020-08-07 DIAGNOSIS — S93691A Other sprain of right foot, initial encounter: Secondary | ICD-10-CM | POA: Insufficient documentation

## 2020-08-07 NOTE — Telephone Encounter (Signed)
Patient called requesting a script for Tramadol.  She had her MRI today and stated that she has an upcoming procedure and needed something to get her through.  She does not have a follow up appt with you yet.  Please advise

## 2020-08-08 ENCOUNTER — Other Ambulatory Visit: Payer: Self-pay | Admitting: Podiatry

## 2020-08-08 MED ORDER — TRAMADOL HCL 50 MG PO TABS: 50.0000 mg | ORAL_TABLET | Freq: Three times a day (TID) | ORAL | 0 refills | Status: AC | PRN

## 2020-08-09 ENCOUNTER — Telehealth: Payer: Self-pay

## 2020-08-09 NOTE — Telephone Encounter (Signed)
-----   Message from Elinor Parkinson, [place] sent at 08/08/2020 12:00 PM EDT ----- Moderate plantar fasciitis.  She could try PT if she desires.

## 2020-08-09 NOTE — Telephone Encounter (Signed)
Patient has been informed of the medication

## 2020-08-09 NOTE — Telephone Encounter (Signed)
Patient has been notified of MRI results and recommendation for physical therapy per Dr. Al Corpus.  She stated that she would try anything to help with the pain she is having.  Script and MRI results has been faxed to [facility] location.  She is aware that they will call her to schedule appt

## 2020-11-02 ENCOUNTER — Telehealth: Payer: Self-pay | Admitting: Podiatry

## 2020-11-02 NOTE — Telephone Encounter (Signed)
Annice Pih from [facility] called stating they received a RX for PT for this pt. Annice Pih states they couldn't get hold of the patient. Patient has reached out to them and wants to start therapy. Annice Pih is calling asking for the MD to update the script. Jackie phone number [phone] and her fax number is  [phone].

## 2020-11-02 NOTE — Telephone Encounter (Signed)
Patient came by the [facility] office today and picked up a new Rx for PT

## 2021-06-28 ENCOUNTER — Ambulatory Visit
Admission: RE | Admit: 2021-06-28 | Discharge: 2021-06-28 | Disposition: A | Discharge status: Home / Self Care | Payer: Medicare HMO | Source: Ambulatory Visit | Attending: Family Medicine | Admitting: Family Medicine

## 2021-06-28 ENCOUNTER — Other Ambulatory Visit: Payer: Self-pay | Admitting: Family Medicine

## 2021-06-28 ENCOUNTER — Other Ambulatory Visit: Payer: Self-pay

## 2021-06-28 ENCOUNTER — Ambulatory Visit
Admission: RE | Admit: 2021-06-28 | Discharge: 2021-06-28 | Disposition: A | Discharge status: Home / Self Care | Payer: Medicare HMO | Attending: Family Medicine | Admitting: Family Medicine

## 2021-06-28 DIAGNOSIS — G8929 Other chronic pain: Secondary | ICD-10-CM

## 2021-11-19 ENCOUNTER — Ambulatory Visit
Admission: EM | Admit: 2021-11-19 | Discharge: 2021-11-19 | Disposition: A | Discharge status: Home / Self Care | Payer: Medicare HMO

## 2021-11-19 ENCOUNTER — Encounter: Payer: Self-pay | Admitting: Emergency Medicine

## 2021-11-19 DIAGNOSIS — N644 Mastodynia: Secondary | ICD-10-CM

## 2021-11-19 MED ORDER — MELOXICAM 15 MG PO TABS: 15.0000 mg | ORAL_TABLET | Freq: Every day | ORAL | 0 refills | Status: AC

## 2021-11-19 MED ORDER — KETOROLAC TROMETHAMINE 60 MG/2ML IM SOLN
30.0000 mg | Freq: Once | INTRAMUSCULAR | Status: AC
Start: 2021-11-19 — End: 2021-11-19
  Administered 2021-11-19: 30 mg via INTRAMUSCULAR

## 2021-11-19 NOTE — ED Triage Notes (Addendum)
Pt c/o pain underneath her left under arm x 2 days. Pt denies chest pain or numbness and tingling in her limbs.

## 2021-11-19 NOTE — Discharge Instructions (Signed)
On exam there are no masses felt within the breast tissue nor underneath to underarm, no there is no swelling to the lymph nodes under the underarm either, as I am able to cause pain on exam there is no cardiac involvement as you cannot replicate this by pushing on the body  You have been given an injection of Toradol here in the office today to reduce any inflammation into your symptoms which in turn will help with your pain,   30 tomorrow take meloxicam every morning with food for 5 days to continue the process above, you may use Tylenol in addition throughout the day  You may use ice or heat over the affected area in 10 to 15-minute intervals  Use pillows underneath your arm and behind back when sitting and lying  If symptoms have not begun to improve by Friday please reach out to your primary doctor and make a follow-up appointment for further evaluation

## 2021-11-19 NOTE — ED Provider Notes (Signed)
[facility]    CSN: [id number] Arrival date & time: 11/19/21  1644      History   Chief Complaint Chief Complaint  Patient presents with   Underarm pain     HPI Brenda Mathews is a 76 y.o. female.   Patient presents with pain underneath the left arm for 3 days.  Pain has been constant and does not radiate, described as an aching with intermittent sharp pains.  Pain can be felt when arm is raised above head and is worsened when lying down.  Has begun to interfere with sleep.  Endorses pain within the left shoulder as well but this has been ongoing due to bursitis.  Denies numbness or tingling, chest pain, shortness of breath, wheezing, coughing.  Last mammogram 1 year ago, negative.    Past Medical History:  Diagnosis Date   DVT (deep venous thrombosis) [facility])     Patient Active Problem List   Diagnosis Date Noted   Back pain 06/06/2020   Displacement of cervical intervertebral disc 06/06/2020   Hiatal hernia 06/06/2020   Menopause 06/06/2020   Neck pain 06/06/2020   Numbness and tingling 06/06/2020   Reflux 06/06/2020   Osteopenia of left forearm 03/10/2017   External hemorrhoid 07/10/2015   Cardiomegaly 10/09/2014   Spinal stenosis in cervical region 04/13/2014   Esophageal reflux 04/12/2014   Vitamin D deficiency 04/12/2014   Lichen sclerosus 05/25/2013   Atrophic vaginitis 10/05/2011   Mixed hyperlipidemia 09/22/2011    Past Surgical History:  Procedure Laterality Date   ABDOMINAL HYSTERECTOMY     BACK SURGERY      OB History   No obstetric history on file.      Home Medications    Prior to Admission medications   Medication Sig Start Date End Date Taking? Authorizing Provider  atorvastatin (LIPITOR) 40 MG tablet TAKE 1 TABLET BY MOUTH EVERYDAY AT BEDTIME 08/04/21  Yes [provider]  estradiol (ESTRACE) 0.1 MG/GM vaginal cream Place vaginally. 04/09/21 04/09/22 Yes [provider]  potassium chloride SA (KLOR-CON M)  20 MEQ tablet Take by mouth. 10/10/21 10/10/22 Yes [provider]  tacrolimus (PROTOPIC) 0.1 % ointment Apply topically. 11/12/21  Yes [provider]  acetaminophen (TYLENOL) 500 MG tablet Take 500 mg by mouth every 6 (six) hours as needed.    [provider]  Cholecalciferol 250 MCG (10000 UT) CAPS Take by mouth.    [provider]  donepezil (ARICEPT) 10 MG tablet Take 10 mg by mouth at bedtime. 11/11/21   [provider]  halobetasol (ULTRAVATE) 0.05 % ointment Apply topically. 06/21/21   [provider]  ibuprofen (ADVIL) 200 MG tablet Take 200 mg by mouth every 6 (six) hours as needed.    [provider]  meloxicam (MOBIC) 15 MG tablet Take 1 tablet (15 mg total) by mouth daily. 06/06/20   Hyatt, Max T, DPM  methylPREDNISolone (MEDROL DOSEPAK) 4 MG TBPK tablet 6 day dose pack - take as directed 06/06/20   Hyatt, Max T, DPM  albuterol (PROVENTIL HFA;VENTOLIN HFA) 108 (90 Base) MCG/ACT inhaler Inhale 1-2 puffs into the lungs every 6 (six) hours as needed for wheezing or shortness of breath. 04/02/17 05/29/19  Tommie Sams, DO  budesonide (PULMICORT) 180 MCG/ACT inhaler Inhale 1 puff into the lungs 2 (two) times daily. 04/02/17 05/29/19  Tommie Sams, DO    Family History Family History  Problem Relation Age of Onset   COPD Mother    Cirrhosis  Father        alcoholic   Diabetes Father    Asthma Father    Breast cancer Neg Hx     Social History Social History   Tobacco Use   Smoking status: Never   Smokeless tobacco: Never  Vaping Use   Vaping Use: Never used  Substance Use Topics   Alcohol use: No   Drug use: No     Allergies   Patient has no known allergies.   Review of Systems Review of Systems Defer to HPI   Physical Exam Triage Vital Signs ED Triage Vitals  Enc Vitals Group     BP 11/19/21 1657 126/65     Pulse Rate 11/19/21 1657 72     Resp 11/19/21 1657 18     Temp 11/19/21 1657 98.3 F (36.8 C)      Temp Source 11/19/21 1657 Oral     SpO2 11/19/21 1657 99 %     Weight --      Height --      Head Circumference --      Peak Flow --      Pain Score 11/19/21 1655 5     Pain Loc --      Pain Edu? --      Excl. in GC? --    No data found.  Updated Vital Signs BP 126/65 (BP Location: Left Arm)   Pulse 72   Temp 98.3 F (36.8 C) (Oral)   Resp 18   SpO2 99%   Visual Acuity Right Eye Distance:   Left Eye Distance:   Bilateral Distance:    Right Eye Near:   Left Eye Near:    Bilateral Near:     Physical Exam Constitutional:      Appearance: Normal appearance.  HENT:     Head: Normocephalic.  Eyes:     Extraocular Movements: Extraocular movements intact.  Pulmonary:     Effort: Pulmonary effort is normal.  Chest:       Comments: Tenderness is present directly underneath the left axilla extending into the left lower quadrant of the breast at approximately 4 to 5 o'clock, no ecchymosis, swelling or deformity present, chest wall is symmetrical, no abnormalities noted to the nipple, no dimpling noted to the skin, skin is warm and appropriate color for ethnicity, no masses felt,  Neurological:     Mental Status: She is alert and oriented to person, place, and time. Mental status is at baseline.  Psychiatric:        Mood and Affect: Mood normal.        Behavior: Behavior normal.      UC Treatments / Results  Labs (all labs ordered are listed, but only abnormal results are displayed) Labs Reviewed - No data to display  EKG   Radiology No results found.  Procedures Procedures (including critical care time)  Medications Ordered in UC Medications - No data to display  Initial Impression / Assessment and Plan / UC Course  I have reviewed the triage vital signs and the nursing notes.  Pertinent labs & imaging results that were available during my care of the patient were reviewed by me and considered in my medical decision making (see chart for details).  Pain  of left breast  Tenderness is noted on exam, will move forward with monthly treatment as no further abnormality or masses are noted, Toradol injection given in office and prescribed meloxicam for outpatient use, may use Tylenol in addition, recommend  ice and heat pillows for additional supportive measures, given strict precautions that if no improvement seen by this Friday which is 4 days from initial assessment she is to follow-up with her primary doctor for further evaluation and management, verbalized understanding Final Clinical Impressions(s) / UC Diagnoses   Final diagnoses:  None   Discharge Instructions   None    ED Prescriptions   None    PDMP not reviewed this encounter.   Valinda Hoar, NP 11/19/21 1730

## 2022-02-13 IMAGING — MR MR ANKLE*R* W/O CM
5 series · 40 of 40 positions shown · non-contrast
Comparison: Plain films right foot 05/29/2020.

CLINICAL DATA: Severe medial right ankle pain for 2 months. No
known injury.

EXAM:
MRI OF THE RIGHT ANKLE WITHOUT CONTRAST
TECHNIQUE: Multiplanar, multisequence MR imaging of the ankle was performed. No
intravenous contrast was administered.

[Series 4: PD fat-sat · axial · right · 3.0mm · 0.50mm/px · z∈[-97,+42]mm · 9 of 36 slices shown]
[im 1/36]
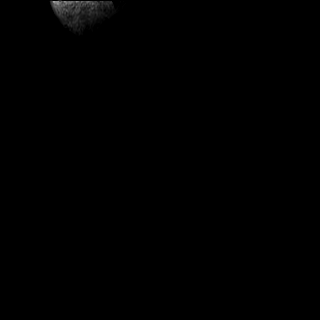
[im 5/36]
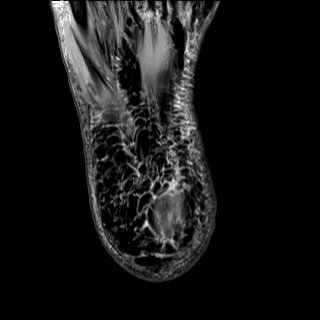
[im 9/36]
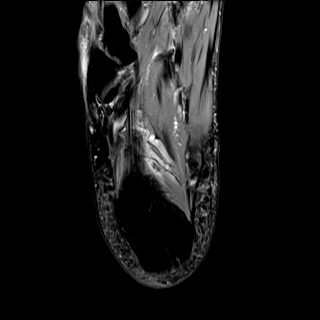
[im 14/36]
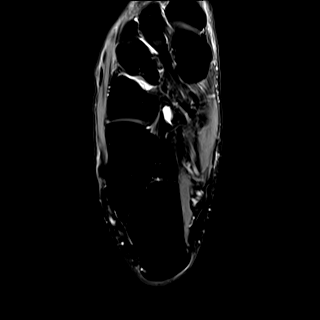
[im 18/36]
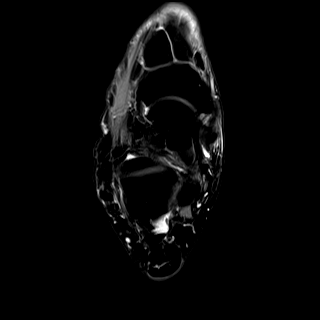
[im 22/36]
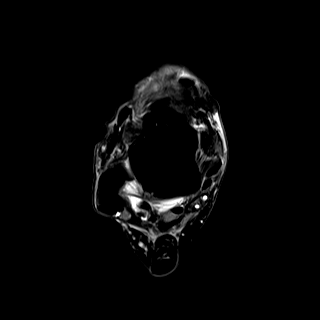
[im 27/36]
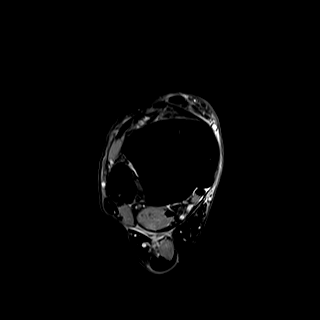
[im 31/36]
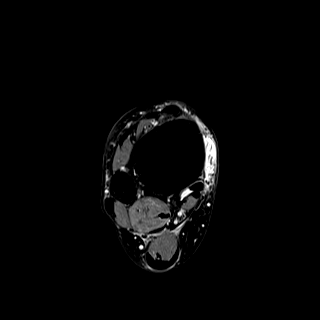
[im 36/36]
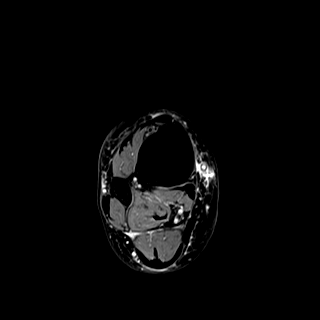

[Series 5: T2 fat-sat · axial · right · 3.0mm · 0.50mm/px · z∈[-97,+42]mm · 9 of 36 slices shown]
[im 1/36]
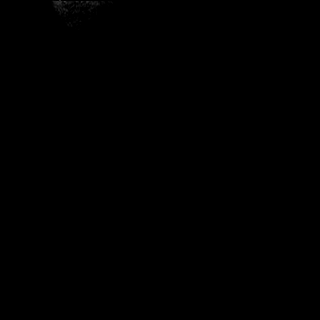
[im 5/36]
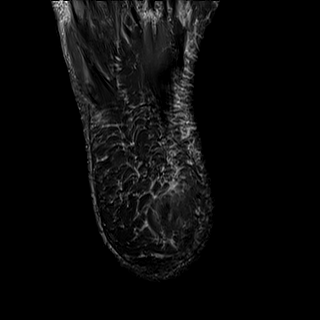
[im 9/36]
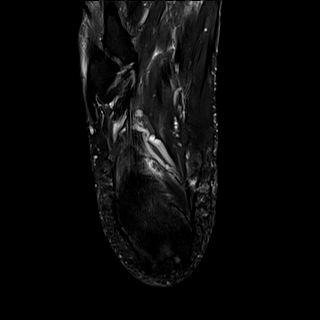
[im 14/36]
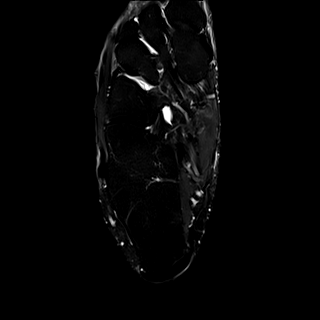
[im 18/36]
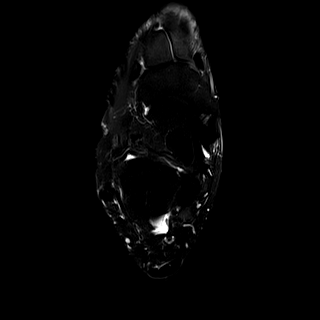
[im 22/36]
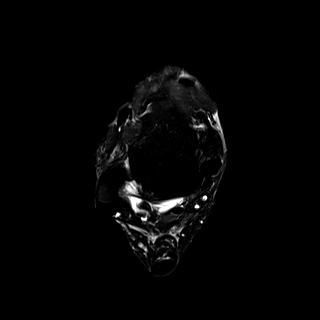
[im 27/36]
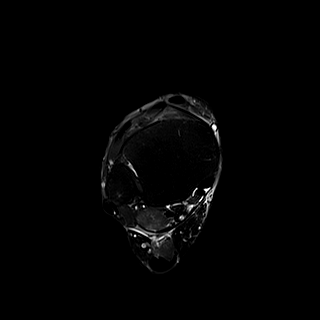
[im 31/36]
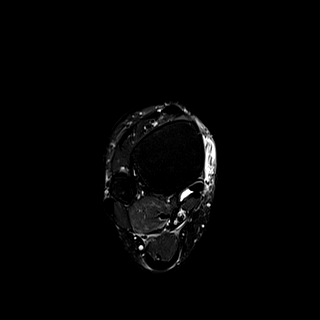
[im 36/36]
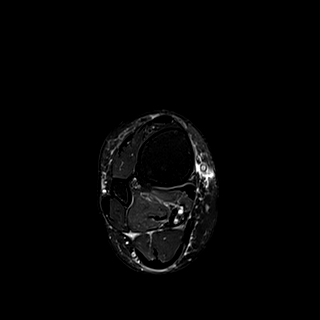

[Series 7: T2 · coronal · right · 3.0mm · 0.62mm/px · 10 of 36 slices shown]
[im 1/36]
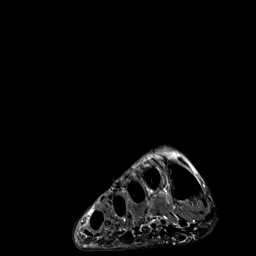
[im 4/36]
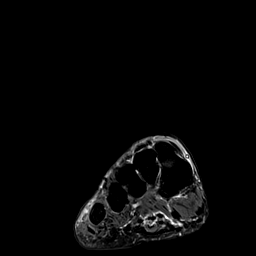
[im 8/36]
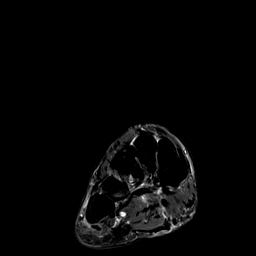
[im 12/36]
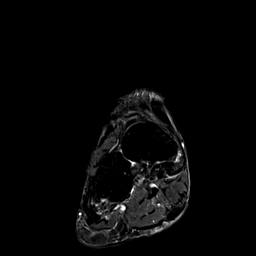
[im 16/36]
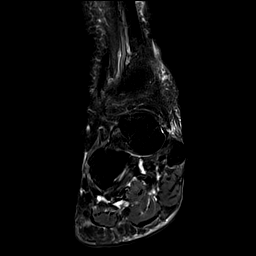
[im 20/36]
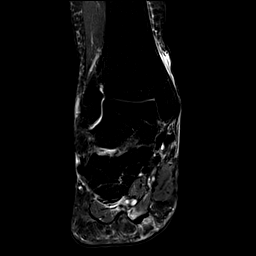
[im 24/36]
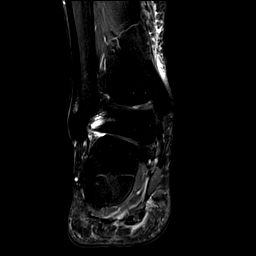
[im 28/36]
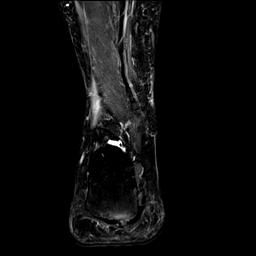
[im 32/36]
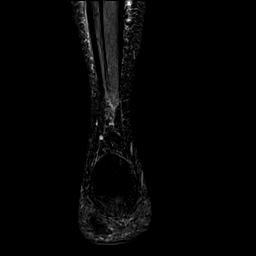
[im 36/36]
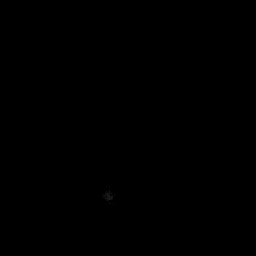

[Series 8: T1 · sagittal · right · 4.0mm · 0.70mm/px · 6 of 21 slices shown]
[im 1/21]
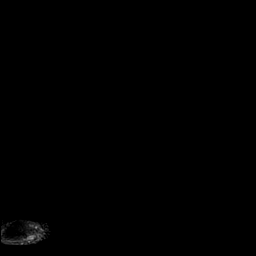
[im 5/21]
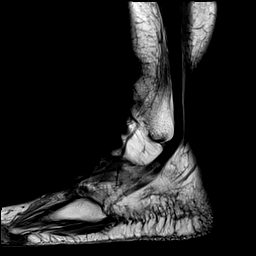
[im 9/21]
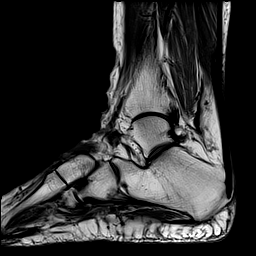
[im 13/21]
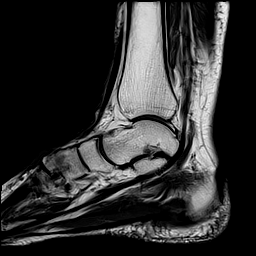
[im 17/21]
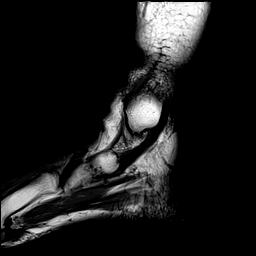
[im 21/21]
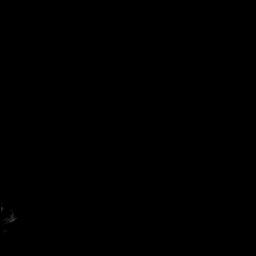

[Series 9: STIR · sagittal · right · 4.0mm · 0.35mm/px · 6 of 21 slices shown]
[im 1/21]
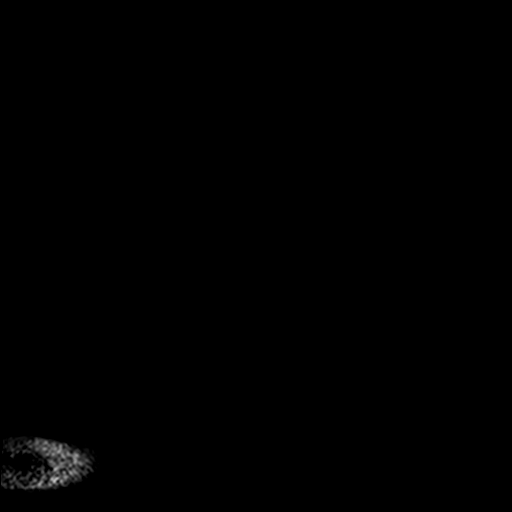
[im 5/21]
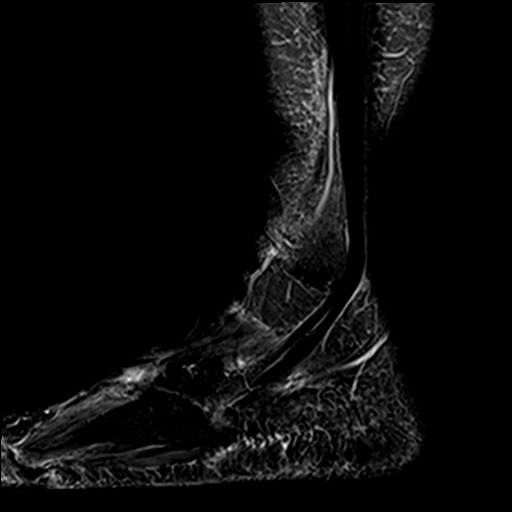
[im 9/21]
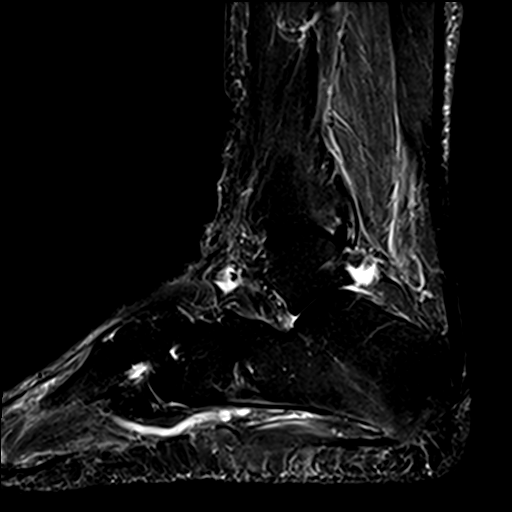
[im 13/21]
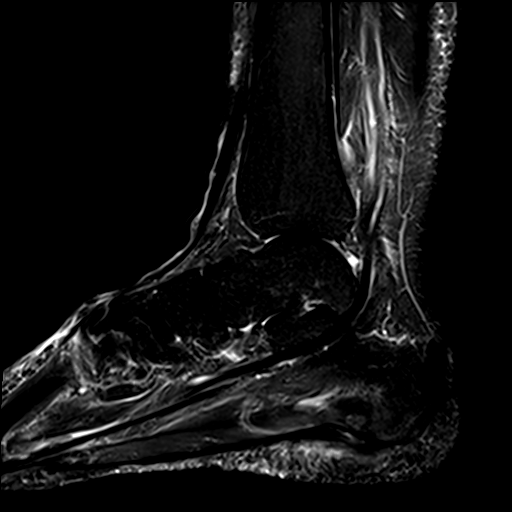
[im 17/21]
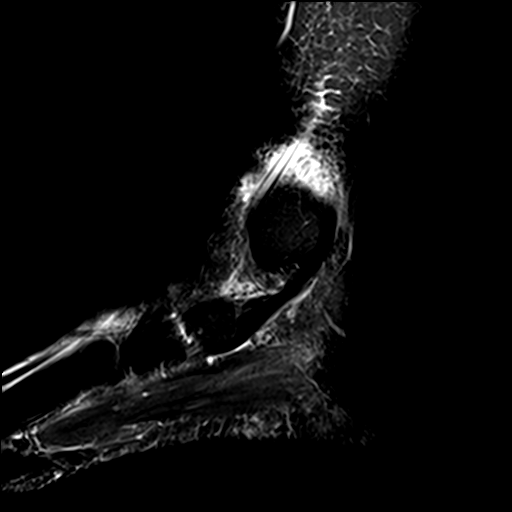
[im 21/21]
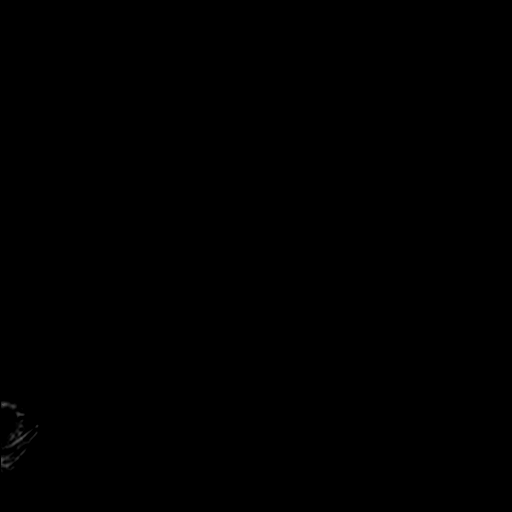

[40 of 40 positions shown; findings below may reference images not displayed]

FINDINGS: TENDONS

Peroneal: Intact.

Posteromedial: Intact.

Anterior: Intact.

Achilles: The tendon has a convex border and is mildly thickened
compatible with tendinosis. No tear.

Plantar Fascia: Thickening and intermediate increased T2 signal in
the medial cord are consistent with plantar fasciitis. No frank
rupture. There is reactive marrow edema in the calcaneus at the
plantar fascia attachment.

LIGAMENTS

Lateral: Intact.

Medial: Intact.

CARTILAGE

Ankle Joint: No joint effusion or osteochondral lesion of the talar
dome.

Subtalar Joints/Sinus Tarsi: Normal.

Bones: No fracture, stress change or worrisome lesion.

Other: None.
IMPRESSION: Moderate to moderately severe appearing plantar fasciitis of the
medial cord without frank rupture. There is reactive marrow edema in
the calcaneus at the plantar fascia attachment.

Mild appearing Achilles tendinosis without tear.

## 2022-04-05 ENCOUNTER — Ambulatory Visit
Admission: EM | Admit: 2022-04-05 | Discharge: 2022-04-05 | Disposition: A | Discharge status: Home / Self Care | Payer: Medicare HMO | Attending: Emergency Medicine | Admitting: Emergency Medicine

## 2022-04-05 ENCOUNTER — Encounter: Payer: Self-pay | Admitting: Emergency Medicine

## 2022-04-05 DIAGNOSIS — J40 Bronchitis, not specified as acute or chronic: Secondary | ICD-10-CM | POA: Diagnosis not present

## 2022-04-05 DIAGNOSIS — J069 Acute upper respiratory infection, unspecified: Secondary | ICD-10-CM

## 2022-04-05 MED ORDER — BENZONATATE 100 MG PO CAPS: 200.0000 mg | ORAL_CAPSULE | Freq: Three times a day (TID) | ORAL | 0 refills | Status: AC

## 2022-04-05 MED ORDER — PROMETHAZINE-DM 6.25-15 MG/5ML PO SYRP: 5.0000 mL | ORAL_SOLUTION | Freq: Four times a day (QID) | ORAL | 0 refills | Status: AC | PRN

## 2022-04-05 MED ORDER — PREDNISONE 10 MG (21) PO TBPK: ORAL_TABLET | ORAL | 0 refills | Status: AC

## 2022-04-05 NOTE — Discharge Instructions (Signed)
Take the prednisone according to the package instructions to help with pulmonary inflammation.  Use the Tessalon Perles every 8 hours for your cough.  Taken with a small sip of water.  They may give you some numbness to the base of your tongue or metallic taste in her mouth, this is normal.  They are designed to calm down the cough reflex.  Use the Promethazine DM cough syrup at bedtime as will make you drowsy.  You may take 1 teaspoon (5 mL) every 6 hours.  Return for reevaluation for new or worsening symptoms.

## 2022-04-05 NOTE — ED Triage Notes (Signed)
Patient c/o cough and chest congestion for 9 days.  Patient states that she finished her antibiotic 2 days ago.  Patient states that he cough has not improved.  Patient states that she had a negative COVID test at her PCP.

## 2022-04-05 NOTE — ED Provider Notes (Signed)
[facility]    CSN: [id number] Arrival date & time: 04/05/22  [id number]      History   Chief Complaint Chief Complaint  Patient presents with   Cough    HPI Alanya Gildner is a 76 y.o. female.   HPI  76 year old female here for evaluation of respiratory complaints.  Patient has a significant past medical history to include cardiomegaly, mixed hyperlipidemia, spinal stenosis, and vitamin D deficiency presenting for evaluation of subjective fever, runny nose and postnasal drip, sneezing, and cough that is productive for clear sputum x 9 days.  She denies any shortness breath or wheezing.  She states that she was seen in an urgent care and prescribed a 6-day course blue capsule antibiotic that she has finished but feels like her symptoms have not improved.  She is unsure what the antibiotic was and the records not available in epic.  Past Medical History:  Diagnosis Date   DVT (deep venous thrombosis) [facility])     Patient Active Problem List   Diagnosis Date Noted   Back pain 06/06/2020   Displacement of cervical intervertebral disc 06/06/2020   Hiatal hernia 06/06/2020   Menopause 06/06/2020   Neck pain 06/06/2020   Numbness and tingling 06/06/2020   Reflux 06/06/2020   Osteopenia of left forearm 03/10/2017   External hemorrhoid 07/10/2015   Cardiomegaly 10/09/2014   Spinal stenosis in cervical region 04/13/2014   Esophageal reflux 04/12/2014   Vitamin D deficiency [id number]   Lichen sclerosus [id number]   Atrophic vaginitis 10/05/2011   Mixed hyperlipidemia 09/22/2011    Past Surgical History:  Procedure Laterality Date   ABDOMINAL HYSTERECTOMY     BACK SURGERY      OB History   No obstetric history on file.      Home Medications    Prior to Admission medications   Medication Sig Start Date End Date Taking? Authorizing Provider  benzonatate (TESSALON) 100 MG capsule Take 2 capsules (200 mg total) by mouth every 8 (eight) hours. 04/05/22   Yes Margarette Canada, NP  predniSONE (STERAPRED UNI-PAK 21 TAB) 10 MG (21) TBPK tablet Take 6 tablets on day 1, 5 tablets day 2, 4 tablets day 3, 3 tablets day 4, 2 tablets day 5, 1 tablet day 6 04/05/22  Yes Margarette Canada, NP  promethazine-dextromethorphan (PROMETHAZINE-DM) 6.25-15 MG/5ML syrup Take 5 mLs by mouth 4 (four) times daily as needed. 04/05/22  Yes Margarette Canada, NP  acetaminophen (TYLENOL) 500 MG tablet Take 500 mg by mouth every 6 (six) hours as needed.    [provider]  atorvastatin (LIPITOR) 40 MG tablet TAKE 1 TABLET BY MOUTH EVERYDAY AT BEDTIME 08/04/21   [provider]  Cholecalciferol 250 MCG (10000 UT) CAPS Take by mouth.    [provider]  donepezil (ARICEPT) 10 MG tablet Take 10 mg by mouth at bedtime. 11/11/21   [provider]  estradiol (ESTRACE) 0.1 MG/GM vaginal cream Place vaginally. 04/09/21 04/09/22  [provider]  halobetasol (ULTRAVATE) 0.05 % ointment Apply topically. 06/21/21   [provider]  ibuprofen (ADVIL) 200 MG tablet Take 200 mg by mouth every 6 (six) hours as needed.    [provider]  meloxicam (MOBIC) 15 MG tablet Take 1 tablet (15 mg total) by mouth daily. 11/19/21   White, Leitha Schuller, NP  potassium chloride SA (KLOR-CON M) 20 MEQ tablet Take by mouth. 10/10/21 10/10/22  [provider]  tacrolimus (PROTOPIC) 0.1 % ointment Apply topically. 11/12/21   [provider]  albuterol (PROVENTIL HFA;VENTOLIN HFA) 108 (90 Base) MCG/ACT inhaler Inhale 1-2 puffs into the lungs every 6 (six) hours as needed for wheezing or shortness of breath. 04/02/17 05/29/19  Tommie Sams, DO  budesonide (PULMICORT) 180 MCG/ACT inhaler Inhale 1 puff into the lungs 2 (two) times daily. 04/02/17 05/29/19  Tommie Sams, DO    Family History Family History  Problem Relation Age of Onset   COPD Mother    Cirrhosis Father        alcoholic   Diabetes Father    Asthma Father    Breast cancer Neg Hx      Social History Social History   Tobacco Use   Smoking status: Never   Smokeless tobacco: Never  Vaping Use   Vaping Use: Never used  Substance Use Topics   Alcohol use: No   Drug use: No     Allergies   Patient has no known allergies.   Review of Systems Review of Systems  Constitutional:  Positive for fever.  HENT:  Positive for congestion, postnasal drip, rhinorrhea and sneezing.   Respiratory:  Positive for cough. Negative for shortness of breath and wheezing.      Physical Exam Triage Vital Signs ED Triage Vitals  Enc Vitals Group     BP 04/05/22 0848 (!) 157/90     Pulse Rate 04/05/22 0848 66     Resp 04/05/22 0848 14     Temp 04/05/22 0848 97.9 F (36.6 C)     Temp Source 04/05/22 0848 Oral     SpO2 04/05/22 0848 95 %     Weight 04/05/22 0845 190 lb 0.6 oz (86.2 kg)     Height 04/05/22 0845 5\' 6"  (1.676 m)     Head Circumference --      Peak Flow --      Pain Score 04/05/22 0845 0     Pain Loc --      Pain Edu? --      Excl. in GC? --    No data found.  Updated Vital Signs BP (!) 157/90 (BP Location: Left Arm)   Pulse 66   Temp 97.9 F (36.6 C) (Oral)   Resp 14   Ht 5\' 6"  (1.676 m)   Wt 190 lb 0.6 oz (86.2 kg)   SpO2 95%   BMI 30.67 kg/m   Visual Acuity Right Eye Distance:   Left Eye Distance:   Bilateral Distance:    Right Eye Near:   Left Eye Near:    Bilateral Near:     Physical Exam Vitals and nursing note reviewed.  Constitutional:      Appearance: Normal appearance. She is not ill-appearing.  HENT:     Head: Normocephalic and atraumatic.     Right Ear: Tympanic membrane, ear canal and external ear normal. There is no impacted cerumen.     Left Ear: Tympanic membrane, ear canal and external ear normal. There is no impacted cerumen.     Nose: Congestion and rhinorrhea present.     Comments: Nasal mucosa is mildly erythematous and edematous with scant light yellow discharge in both nares.    Mouth/Throat:     Mouth: Mucous  membranes are moist.     Pharynx: Oropharynx is clear. No oropharyngeal exudate or posterior oropharyngeal erythema.  Cardiovascular:     Rate and Rhythm: Normal rate and regular rhythm.     Pulses: Normal pulses.     Heart sounds: Normal heart sounds. No  murmur heard.    No friction rub. No gallop.  Pulmonary:     Effort: Pulmonary effort is normal.     Breath sounds: Rhonchi present. No wheezing or rales.     Comments: Patient has faint scattered rhonchi in her upper lung fields bilaterally. Musculoskeletal:     Cervical back: Normal range of motion and neck supple.  Lymphadenopathy:     Cervical: No cervical adenopathy.  Skin:    General: Skin is warm.     Capillary Refill: Capillary refill takes less than 2 seconds.  Neurological:     General: No focal deficit present.     Mental Status: She is alert and oriented to person, place, and time.  Psychiatric:        Mood and Affect: Mood normal.        Behavior: Behavior normal.        Thought Content: Thought content normal.        Judgment: Judgment normal.      UC Treatments / Results  Labs (all labs ordered are listed, but only abnormal results are displayed) Labs Reviewed - No data to display  EKG   Radiology No results found.  Procedures Procedures (including critical care time)  Medications Ordered in UC Medications - No data to display  Initial Impression / Assessment and Plan / UC Course  I have reviewed the triage vital signs and the nursing notes.  Pertinent labs & imaging results that were available during my care of the patient were reviewed by me and considered in my medical decision making (see chart for details).   Patient is a nontoxic-appearing 76 year old female here for evaluation of ongoing cough and chest congestion with some associated upper respiratory symptoms x 9 days.  She was recently on a 6-day course of an antibiotic.  She describes antibiotic as being a blue capsule that she took twice  a day.  The records are not available in epic but could possibly be doxycycline.  She states that her cough continues but her sputum has gone from light yellow to clear.  She continues to have runny nose and postnasal drip.  She reports subjective fever but she is afebrile here in clinic.  On exam there is inflammation of her upper respiratory tree and she is got faint rhonchi in the upper lung fields bilaterally.  I suspect that the patient has mild bronchitis as well as an upper respiratory infection.  I suspect that her infections are actually viral and not bacterial and that is why the doxycycline did not improve her symptoms.  I will prescribe her a prednisone taper to help with her pulmonary inflammation as well as Tessalon Perles and Promethazine DM cough syrup to help with cough and congestion.  She is not having any wheezing or shortness of breath so I will not prescribe an albuterol inhaler.  Return precautions reviewed.   Final Clinical Impressions(s) / UC Diagnoses   Final diagnoses:  Upper respiratory tract infection, unspecified type  Bronchitis     Discharge Instructions      Take the prednisone according to the package instructions to help with pulmonary inflammation.  Use the Tessalon Perles every 8 hours for your cough.  Taken with a small sip of water.  They may give you some numbness to the base of your tongue or metallic taste in her mouth, this is normal.  They are designed to calm down the cough reflex.  Use the Promethazine DM cough syrup at bedtime  as will make you drowsy.  You may take 1 teaspoon (5 mL) every 6 hours.  Return for reevaluation for new or worsening symptoms.      ED Prescriptions     Medication Sig Dispense Auth. Provider   predniSONE (STERAPRED UNI-PAK 21 TAB) 10 MG (21) TBPK tablet Take 6 tablets on day 1, 5 tablets day 2, 4 tablets day 3, 3 tablets day 4, 2 tablets day 5, 1 tablet day 6 21 tablet Margarette Canada, NP   benzonatate (TESSALON) 100 MG  capsule Take 2 capsules (200 mg total) by mouth every 8 (eight) hours. 21 capsule Margarette Canada, NP   promethazine-dextromethorphan (PROMETHAZINE-DM) 6.25-15 MG/5ML syrup Take 5 mLs by mouth 4 (four) times daily as needed. 118 mL Margarette Canada, NP      PDMP not reviewed this encounter.   Margarette Canada, NP 04/05/22 [phone]

## 2022-04-08 ENCOUNTER — Encounter: Payer: Self-pay | Admitting: Emergency Medicine

## 2022-04-08 ENCOUNTER — Ambulatory Visit
Admission: EM | Admit: 2022-04-08 | Discharge: 2022-04-08 | Payer: Medicare HMO | Attending: Family Medicine | Admitting: Family Medicine

## 2022-04-08 DIAGNOSIS — R079 Chest pain, unspecified: Secondary | ICD-10-CM

## 2022-04-08 NOTE — Discharge Instructions (Signed)
-  The way that you are describing your chest pain is a little bit of a red flag and I think you need further workup in the emergency department to rule out any problem with your heart.  As we discussed, they can check cardiac enzymes and do serial EKGs to make sure there is nothing wrong with your heart before you embark on your cruise.  Please do not delay go there at this time since you have declined EMS transport.  You have been advised to follow up immediately in the emergency department for concerning signs.symptoms. If you declined EMS transport, please have a family member take you directly to the ED at this time. Do not delay. Based on concerns about condition, if you do not follow up in th e ED, you may risk poor outcomes including worsening of condition, delayed treatment and potentially life threatening issues. If you have declined to go to the ED at this time, you should call your PCP immediately to set up a follow up appointment.  Go to ED for red flag symptoms, including; fevers you cannot reduce with Tylenol/Motrin, severe headaches, vision changes, numbness/weakness in part of the body, lethargy, confusion, intractable vomiting, severe dehydration, chest pain, breathing difficulty, severe persistent abdominal or pelvic pain, signs of severe infection (increased redness, swelling of an area), feeling faint or passing out, dizziness, etc. You should especially go to the ED for sudden acute worsening of condition if you do not elect to go at this time.

## 2022-04-08 NOTE — ED Notes (Signed)
Patient is being discharged from the Urgent Care and sent to the Emergency Department via personal vehicle(boyfriend driving) . Per Jerene Pitch PA, patient is in need of higher level of care due to Chest Pain. Patient is aware and verbalizes understanding of plan of care.  Vitals:   04/08/22 0902  BP: (!) 158/89  Pulse: 60  Resp: 16  Temp: 97.7 F (36.5 C)  SpO2: 100%

## 2022-04-08 NOTE — ED Triage Notes (Signed)
Pt has been seen several times in the past 2 weeks for a cough. Yesterday she developed pressure on the left side of her chest radiating to her left arm.

## 2022-04-08 NOTE — ED Provider Notes (Signed)
[facility]    CSN: [id number] Arrival date & time: 04/08/22  0830      History   Chief Complaint Chief Complaint  Patient presents with   Chest Pain   Cough    HPI Brenda Mathews is a 77 y.o. female presenting for left-sided chest pain which she describes as a pressure off and on all day yesterday.  Patient says it woke her up this morning at about 6 AM and lasted for 30 minutes.  She describes it as a heavy feeling and states "it felt like something sitting on my chest."  She says the pain radiated into the left arm just above the elbow.  She does not describe the pain as pleuritic.  No numbness, weeks or tingling.  No palpitations, dizziness, shortness of breath or leg swelling.  She has had a cough and congestion for the past couple of weeks.  She was seen here 3 days ago and also bronchitis.  She was prescribed prednisone and benzonatate as well as Promethazine DM.  Previous to that she was prescribed cefdinir course which she completed.  She says she feels okay now and is not really experiencing the chest discomfort but is supposed to leave on a cruise tomorrow and be gone for couple weeks that she thought she should get checked out.  Medical history significant for cardiomegaly, hyperlipidemia, spinal stenosis and previous back surgery with resulting DVT.  Patient denies any history of MI or stroke.  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      Home Medications    Prior to Admission medications   Medication Sig Start Date End Date Taking? Authorizing Provider  acetaminophen (TYLENOL) 500 MG tablet Take 500 mg by mouth every 6 (six) hours as needed.    [provider]  atorvastatin (LIPITOR) 40 MG tablet TAKE 1 TABLET BY MOUTH EVERYDAY AT BEDTIME 08/04/21   [provider]  benzonatate (TESSALON) 100 MG capsule Take 2 capsules (200 mg total) by mouth every 8 (eight) hours. 04/05/22   Margarette Canada, NP  Cholecalciferol 250 MCG (10000 UT) CAPS Take by mouth.    [provider]  donepezil (ARICEPT) 10 MG tablet Take 10 mg by mouth at bedtime. 11/11/21   [provider]  estradiol (ESTRACE) 0.1 MG/GM vaginal cream Place vaginally. 04/09/21 04/09/22  [provider]  halobetasol (ULTRAVATE) 0.05 % ointment Apply topically. 06/21/21   [provider]  ibuprofen (ADVIL) 200 MG tablet Take 200 mg by mouth every 6 (six) hours as needed.    [provider]  meloxicam (MOBIC) 15 MG tablet Take 1 tablet (15 mg total) by mouth daily. 11/19/21  White, Adrienne R, NP  potassium chloride SA (KLOR-CON M) 20 MEQ tablet Take by mouth. 10/10/21 10/10/22  [provider]  predniSONE (STERAPRED UNI-PAK 21 TAB) 10 MG (21) TBPK tablet Take 6 tablets on day 1, 5 tablets day 2, 4 tablets day 3, 3 tablets day 4, 2 tablets day 5, 1 tablet day 6 04/05/22   Becky Augusta, NP  promethazine-dextromethorphan (PROMETHAZINE-DM) 6.25-15 MG/5ML syrup Take 5 mLs by mouth 4 (four) times daily as needed. 04/05/22   Becky Augusta, NP  tacrolimus (PROTOPIC) 0.1 % ointment Apply topically. 11/12/21   [provider]  albuterol (PROVENTIL HFA;VENTOLIN HFA) 108 (90  Base) MCG/ACT inhaler Inhale 1-2 puffs into the lungs every 6 (six) hours as needed for wheezing or shortness of breath. 04/02/17 05/29/19  Tommie Sams, DO  budesonide (PULMICORT) 180 MCG/ACT inhaler Inhale 1 puff into the lungs 2 (two) times daily. 04/02/17 05/29/19  Tommie Sams, DO    Family History Family History  Problem Relation Age of Onset   COPD Mother    Cirrhosis Father        alcoholic   Diabetes Father    Asthma Father    Breast cancer Neg Hx     Social History Social History   Tobacco Use   Smoking status: Never   Smokeless tobacco: Never  Vaping Use   Vaping Use: Never used  Substance Use Topics   Alcohol use: No   Drug use: No     Allergies   Gabapentin and Meloxicam   Review of Systems Review of Systems  Constitutional:  Negative for fatigue and fever.  HENT:  Positive for congestion. Negative for rhinorrhea.   Respiratory:  Positive for cough and chest tightness. Negative for shortness of breath and wheezing.   Cardiovascular:  Positive for chest pain. Negative for palpitations and leg swelling.  Gastrointestinal:  Negative for abdominal pain, nausea and vomiting.  Musculoskeletal:  Negative for back pain.  Neurological:  Negative for dizziness, weakness, numbness and headaches.     Physical Exam Triage Vital Signs ED Triage Vitals  Enc Vitals Group     BP 04/08/22 0902 (!) 158/89     Pulse Rate 04/08/22 0902 60     Resp 04/08/22 0902 16     Temp 04/08/22 0902 97.7 F (36.5 C)     Temp src --      SpO2 04/08/22 0902 100 %     Weight --      Height --      Head Circumference --      Peak Flow --      Pain Score 04/08/22 0846 4     Pain Loc --      Pain Edu? --      Excl. in GC? --    No data found.  Updated Vital Signs BP (!) 158/89 (BP Location: Left Arm)   Pulse 60   Temp 97.7 F (36.5 C)   Resp 16   SpO2 100%      Physical Exam Vitals and nursing note reviewed.  Constitutional:      General: She is not in acute  distress.    Appearance: Normal appearance. She is not ill-appearing or toxic-appearing.  HENT:     Head: Normocephalic and atraumatic.     Nose: Nose normal.     Mouth/Throat:     Mouth: Mucous membranes are moist.     Pharynx: Oropharynx is clear.  Eyes:     General: No  scleral icterus.       Right eye: No discharge.        Left eye: No discharge.     Conjunctiva/sclera: Conjunctivae normal.  Cardiovascular:     Rate and Rhythm: Normal rate and regular rhythm.     Heart sounds: Normal heart sounds.  Pulmonary:     Effort: Pulmonary effort is normal. No respiratory distress.     Breath sounds: No wheezing, rhonchi or rales.  Chest:     Chest wall: No tenderness.  Musculoskeletal:     Cervical back: Neck supple.  Skin:    General: Skin is dry.  Neurological:     General: No focal deficit present.     Mental Status: She is alert and oriented to person, place, and time. Mental status is at baseline.     Motor: No weakness.     Gait: Gait normal.  Psychiatric:        Mood and Affect: Mood normal.        Behavior: Behavior normal.        Thought Content: Thought content normal.      UC Treatments / Results  Labs (all labs ordered are listed, but only abnormal results are displayed) Labs Reviewed - No data to display  EKG   Radiology No results found.  Procedures ED EKG  Date/Time: 04/08/2022 9:14 AM  Performed by: Danton Clap, PA-C Authorized by: Lyndee Hensen, DO   Previous ECG:    Previous ECG:  Unavailable Interpretation:    Interpretation: normal   Rate:    ECG rate:  61   ECG rate assessment: normal   Rhythm:    Rhythm: sinus rhythm   Ectopy:    Ectopy: none     Ectopy comment:  Previous EKG within 5 min shows possible PACs QRS:    QRS axis:  Normal   QRS intervals:  Normal   QRS conduction: normal   ST segments:    ST segments:  Normal T waves:    T waves: normal   Comments:     Normal sinus rhythm and regular rate. Another EKG  performed around the same time shows PACs  (including critical care time)  Medications Ordered in UC Medications - No data to display  Initial Impression / Assessment and Plan / UC Course  I have reviewed the triage vital signs and the nursing notes.  Pertinent labs & imaging results that were available during my care of the patient were reviewed by me and considered in my medical decision making (see chart for details).   76 year old female with history of cardiomegaly, hyperlipidemia and DVT presents for left-sided chest pressure off and on all day yesterday and significant chest heaviness and pressure that lasted for 30 minutes a couple of hours ago and radiated into the left arm.  She says she still feels a little "off."  She denies any chest pain at this time and has not had any shortness of breath, palpitations, dizziness, syncope or weakness.  She has had cough and congestion for the past 2 weeks and been on antibiotics.  She was recently prescribed prednisone a couple days ago.  No history of MI or stroke.  BP elevated at 158/89.  Other vitals normal and stable.  She is in no acute distress.  On exam normal HEENT exam.  Chest clear auscultation heart regular rate and rhythm.  No leg swelling noted.  EKG performed today shows normal sinus rhythm and regular rate of 61  bpm.  Another EKG performed around the same time shows PACs.  Advised patient that based on the way she describes her symptoms as a heaviness and "something sitting on my chest" with pain radiating to left upper extremity, I have concern for possible NSTEMI.  Advised further workup in the emergency department to likely include troponins and serial EKGs as well as other workup to assess for possible pneumonia, PE, etc.  Patient is reluctantly agreeable with this her boyfriend (who is here with her) will take her to emergency department at this time.  She does not want to go by EMS.  She is leaving in stable condition.   Final  Clinical Impressions(s) / UC Diagnoses   Final diagnoses:  Left-sided chest pain     Discharge Instructions      -The way that you are describing your chest pain is a little bit of a red flag and I think you need further workup in the emergency department to rule out any problem with your heart.  As we discussed, they can check cardiac enzymes and do serial EKGs to make sure there is nothing wrong with your heart before you embark on your cruise.  Please do not delay go there at this time since you have declined EMS transport.  You have been advised to follow up immediately in the emergency department for concerning signs.symptoms. If you declined EMS transport, please have a family member take you directly to the ED at this time. Do not delay. Based on concerns about condition, if you do not follow up in th e ED, you may risk poor outcomes including worsening of condition, delayed treatment and potentially life threatening issues. If you have declined to go to the ED at this time, you should call your PCP immediately to set up a follow up appointment.  Go to ED for red flag symptoms, including; fevers you cannot reduce with Tylenol/Motrin, severe headaches, vision changes, numbness/weakness in part of the body, lethargy, confusion, intractable vomiting, severe dehydration, chest pain, breathing difficulty, severe persistent abdominal or pelvic pain, signs of severe infection (increased redness, swelling of an area), feeling faint or passing out, dizziness, etc. You should especially go to the ED for sudden acute worsening of condition if you do not elect to go at this time.    ED Prescriptions   None    PDMP not reviewed this encounter.   Shirlee Latch, PA-C 04/08/22 [id number]

## 2023-03-23 ENCOUNTER — Other Ambulatory Visit: Payer: Self-pay | Admitting: Family Medicine

## 2023-03-23 DIAGNOSIS — Z780 Asymptomatic menopausal state: Secondary | ICD-10-CM
